# Patient Record
Sex: Female | Born: 2000
Health system: Southern US, Community
[De-identification: ages and names within clinical notes are randomized; demographics above are authoritative.]

## PROBLEM LIST (undated history)

## (undated) ENCOUNTER — Emergency Department (HOSPITAL_COMMUNITY): Admission: EM | Payer: Medicaid Other | Source: Home / Self Care

## (undated) ENCOUNTER — Emergency Department (HOSPITAL_BASED_OUTPATIENT_CLINIC_OR_DEPARTMENT_OTHER): Payer: No Typology Code available for payment source

## (undated) DIAGNOSIS — J45909 Unspecified asthma, uncomplicated: Secondary | ICD-10-CM

## (undated) HISTORY — PX: NO PAST SURGERIES: SHX2092

---
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
## 2021-01-01 ENCOUNTER — Telehealth (HOSPITAL_COMMUNITY): Payer: Self-pay | Admitting: Emergency Medicine

## 2021-01-01 LAB — CERVICOVAGINAL ANCILLARY ONLY
Bacterial Vaginitis (gardnerella): POSITIVE — AB
Chlamydia: NEGATIVE
Comment: NEGATIVE
Comment: NEGATIVE
Comment: NEGATIVE
Comment: NORMAL
Neisseria Gonorrhea: NEGATIVE
Trichomonas: NEGATIVE

## 2021-01-01 MED ORDER — METRONIDAZOLE 500 MG PO TABS: 500.0000 mg | ORAL_TABLET | Freq: Two times a day (BID) | ORAL | 0 refills | Status: DC

## 2021-01-18 ENCOUNTER — Telehealth: Payer: Self-pay | Admitting: Emergency Medicine

## 2021-01-18 MED ORDER — METRONIDAZOLE 500 MG PO TABS
500.0000 mg | ORAL_TABLET | Freq: Two times a day (BID) | ORAL | 0 refills | Status: DC
Start: 2021-01-18 — End: 2021-02-17

## 2021-02-17 ENCOUNTER — Other Ambulatory Visit: Payer: Self-pay

## 2021-02-17 ENCOUNTER — Ambulatory Visit
Admission: EM | Admit: 2021-02-17 | Discharge: 2021-02-17 | Disposition: A | Discharge status: Home / Self Care | Payer: Medicaid Other | Attending: Internal Medicine | Admitting: Internal Medicine

## 2021-02-17 DIAGNOSIS — R350 Frequency of micturition: Secondary | ICD-10-CM | POA: Insufficient documentation

## 2021-02-17 DIAGNOSIS — N898 Other specified noninflammatory disorders of vagina: Secondary | ICD-10-CM | POA: Diagnosis present

## 2021-02-17 DIAGNOSIS — Z113 Encounter for screening for infections with a predominantly sexual mode of transmission: Secondary | ICD-10-CM | POA: Insufficient documentation

## 2021-02-17 LAB — POCT URINALYSIS DIP (MANUAL ENTRY)
Bilirubin, UA: NEGATIVE
Blood, UA: NEGATIVE
Glucose, UA: NEGATIVE mg/dL
Ketones, POC UA: NEGATIVE mg/dL
Nitrite, UA: NEGATIVE
Protein Ur, POC: NEGATIVE mg/dL
Spec Grav, UA: 1.02 (ref 1.010–1.025)
Urobilinogen, UA: 0.2 E.U./dL
pH, UA: 7 (ref 5.0–8.0)

## 2021-02-17 MED ORDER — METRONIDAZOLE 500 MG PO TABS: 500.0000 mg | ORAL_TABLET | Freq: Two times a day (BID) | ORAL | 0 refills | Status: DC

## 2021-02-17 NOTE — Discharge Instructions (Addendum)
Your vaginal swab and urine culture are pending.  We will call if they are positive.  You have been prescribed metronidazole to take for possible bacterial vaginosis.  Please complete this prescription.  Refrain from sexual activity until test results and treatment are complete.

## 2021-02-17 NOTE — ED Triage Notes (Signed)
Pt states she was here about a month ago and dx with BV. States she interrupted her abx tx for "a night out drinking." States the symptoms never fully resolved though was starting to get better. States she is coming "off of my cycle" and notices a strong odor and would like to be tested and tx once more.   Associated discharge (moderate-significant, thick, white-greyish), frequency, urgency,

## 2021-02-17 NOTE — ED Provider Notes (Signed)
[name]    CSN: [id number] Arrival date & time: 02/17/21  1041      History   Chief Complaint Chief Complaint  Patient presents with   Vaginitis    HPI Heather Hopkins is a 20 y.o. female.   Patient presents with white-colored vaginal discharge and urinary frequency that has been present for approximately 1 month.  Patient was seen on 12/31/2020 and tested positive for bacterial vaginosis.  Was prescribed metronidazole but did not complete medication because she wanted to "go out drinking".  Symptoms never completely resolved and have persisted.  Patient denies irregular vaginal bleeding, pelvic pain, abdominal pain, back pain, fever, urinary burning.  Last menstrual cycle was a few days ago.  Denies any known exposure to STD.    Past Medical History:  Diagnosis Date   Asthma     There are no problems to display for this patient.   History reviewed. No pertinent surgical history.  OB History   No obstetric history on file.      Home Medications    Prior to Admission medications   Medication Sig Start Date End Date Taking? Authorizing Provider  acetaminophen (TYLENOL) 325 MG tablet Take 2 tablets (650 mg total) by mouth every 6 (six) hours as needed. 09/25/20   Linwood Dibbles, MD  metroNIDAZOLE (FLAGYL) 500 MG tablet Take 1 tablet (500 mg total) by mouth 2 (two) times daily. 02/17/21   Lance Muss, FNP  ondansetron (ZOFRAN-ODT) 8 MG disintegrating tablet Take 1 tablet (8 mg total) by mouth every 8 (eight) hours as needed for nausea or vomiting. 12/31/20   Wallis Bamberg, PA-C  promethazine (PHENERGAN) 12.5 MG tablet Take 12.5 mg by mouth daily. 04/25/19 09/25/19  [provider]    Family History Family History  Problem Relation Age of Onset   Lupus Mother    Hypertension Father     Social History Social History   Tobacco Use   Smoking status: Some Days    Types: Cigars   Smokeless tobacco: Never  Vaping Use   Vaping Use: Never used   Substance Use Topics   Alcohol use: Yes   Drug use: Yes    Types: Marijuana     Allergies   Patient has no known allergies.   Review of Systems Review of Systems Per HPI  Physical Exam Triage Vital Signs ED Triage Vitals [02/17/21 1119]  Enc Vitals Group     BP (!) 141/77     Pulse Rate 100     Resp 18     Temp 98.2 F (36.8 C)     Temp Source Oral     SpO2 96 %     Weight      Height      Head Circumference      Peak Flow      Pain Score 0     Pain Loc      Pain Edu?      Excl. in GC?    No data found.  Updated Vital Signs BP (!) 141/77 (BP Location: Left Arm)   Pulse 100   Temp 98.2 F (36.8 C) (Oral)   Resp 18   SpO2 96%   Visual Acuity Right Eye Distance:   Left Eye Distance:   Bilateral Distance:    Right Eye Near:   Left Eye Near:    Bilateral Near:     Physical Exam Constitutional:      General: She is not in acute distress.  Appearance: Normal appearance. She is not toxic-appearing or diaphoretic.  HENT:     Head: Normocephalic and atraumatic.  Eyes:     Extraocular Movements: Extraocular movements intact.     Conjunctiva/sclera: Conjunctivae normal.  Cardiovascular:     Rate and Rhythm: Normal rate and regular rhythm.     Pulses: Normal pulses.     Heart sounds: Normal heart sounds.  Pulmonary:     Effort: Pulmonary effort is normal. No respiratory distress.     Breath sounds: Normal breath sounds.  Genitourinary:    Comments: Deferred with shared decision making.  Self swab performed. Skin:    General: Skin is warm and dry.  Neurological:     General: No focal deficit present.     Mental Status: She is alert and oriented to person, place, and time. Mental status is at baseline.  Psychiatric:        Mood and Affect: Mood normal.        Behavior: Behavior normal.        Thought Content: Thought content normal.        Judgment: Judgment normal.     UC Treatments / Results  Labs (all labs ordered are listed, but only  abnormal results are displayed) Labs Reviewed  POCT URINALYSIS DIP (MANUAL ENTRY) - Abnormal; Notable for the following components:      Result Value   Leukocytes, UA Trace (*)    All other components within normal limits  URINE CULTURE  CERVICOVAGINAL ANCILLARY ONLY    EKG   Radiology No results found.  Procedures Procedures (including critical care time)  Medications Ordered in UC Medications - No data to display  Initial Impression / Assessment and Plan / UC Course  I have reviewed the triage vital signs and the nursing notes.  Pertinent labs & imaging results that were available during my care of the patient were reviewed by me and considered in my medical decision making (see chart for details).     Urinalysis was not definitive for urinary tract infection.  Cervicovaginal swab and urine culture are pending.  Will treat with metronidazole due to symptoms never completely resolving and previous positive results for BV.  Patient advised of the importance of completing medication.  Advised patient to refrain from sexual activity until test results are complete.  No red flags seen on exam.Discussed strict return precautions. Patient verbalized understanding and is agreeable with plan.  Final Clinical Impressions(s) / UC Diagnoses   Final diagnoses:  Vaginal discharge  Urinary frequency  Screening examination for venereal disease     Discharge Instructions      Your vaginal swab and urine culture are pending.  We will call if they are positive.  You have been prescribed metronidazole to take for possible bacterial vaginosis.  Please complete this prescription.  Refrain from sexual activity until test results and treatment are complete.     ED Prescriptions     Medication Sig Dispense Auth. Provider   metroNIDAZOLE (FLAGYL) 500 MG tablet Take 1 tablet (500 mg total) by mouth 2 (two) times daily. 14 tablet Lance Muss, FNP      PDMP not reviewed this  encounter.   Lance Muss, FNP 02/17/21 1218

## 2021-02-18 LAB — URINE CULTURE: Culture: NO GROWTH

## 2021-02-18 LAB — CERVICOVAGINAL ANCILLARY ONLY
Bacterial Vaginitis (gardnerella): POSITIVE — AB
Candida Glabrata: NEGATIVE
Candida Vaginitis: NEGATIVE
Chlamydia: NEGATIVE
Comment: NEGATIVE
Comment: NEGATIVE
Comment: NEGATIVE
Comment: NEGATIVE
Comment: NEGATIVE
Comment: NORMAL
Neisseria Gonorrhea: NEGATIVE
Trichomonas: NEGATIVE

## 2021-05-23 NOTE — L&D Delivery Note (Signed)
Patient was C/C/+2 and pushed for approximately 35 minutes with epidural.    NSVD  female infant, Apgars 9/9, weight pending.   The patient had bilateral anterior labial abrasions, not bleeding so no repair needed. Fundus was firm. EBL was expected amount. Placenta was delivered intact. Vagina was clear.  Delayed cord clamping done for 30-60 seconds while warming baby. Baby was vigorous and doing skin to skin with mother.  Heather Hopkins

## 2021-06-01 ENCOUNTER — Other Ambulatory Visit: Payer: Self-pay

## 2021-06-01 ENCOUNTER — Ambulatory Visit
Admission: EM | Admit: 2021-06-01 | Discharge: 2021-06-01 | Disposition: A | Discharge status: Home / Self Care | Payer: Medicaid Other | Attending: Internal Medicine | Admitting: Internal Medicine

## 2021-06-01 ENCOUNTER — Encounter: Payer: Self-pay | Admitting: Emergency Medicine

## 2021-06-01 DIAGNOSIS — N898 Other specified noninflammatory disorders of vagina: Secondary | ICD-10-CM | POA: Diagnosis not present

## 2021-06-01 DIAGNOSIS — Z3201 Encounter for pregnancy test, result positive: Secondary | ICD-10-CM

## 2021-06-01 LAB — POCT URINE PREGNANCY: Preg Test, Ur: POSITIVE — AB

## 2021-06-01 NOTE — ED Triage Notes (Signed)
Patient states that she's been having thicker than normal vaginal discharge.  No odor, some itching. White in color.  Patient did have a miscarriage on 03-25-2021 and requesting STI testing.

## 2021-06-01 NOTE — ED Provider Notes (Signed)
[name]    CSN: [id number] Arrival date & time: 06/01/21  1218      History   Chief Complaint Chief Complaint  Patient presents with   Vaginal Discharge    HPI Heather Hopkins is a 21 y.o. female.   Patient presents with vaginal discharge that has been present over the past few days.  She reports that it is white in color.  Denies urinary burning, urinary frequency, irregular vaginal bleeding, pelvic pain, back pain, fever, abdominal pain.  Denies any known exposure to STD but has had unprotected sexual intercourse recently.  Patient reports that her last menstrual cycle was 04/25/2021.  She had a miscarriage in November 2022.  She reports that she was followed by OB/GYN following this miscarriage and her hCG levels went from 300s to 80 to 0 by November 16.  She has not followed up with OB/GYN since this.  She does report that she has had some breast tenderness, abdominal cramping, nausea over the past few days as well.   Vaginal Discharge  Past Medical History:  Diagnosis Date   Asthma     There are no problems to display for this patient.   History reviewed. No pertinent surgical history.  OB History   No obstetric history on file.      Home Medications    Prior to Admission medications   Medication Sig Start Date End Date Taking? Authorizing Provider  Prenatal Vit-Fe Fumarate-FA (PRENATAL MULTIVITAMIN) TABS tablet Take 1 tablet by mouth daily at 12 noon.   Yes [provider]  acetaminophen (TYLENOL) 325 MG tablet Take 2 tablets (650 mg total) by mouth every 6 (six) hours as needed. 09/25/20   Linwood DibblesKnapp, Jon, MD  metroNIDAZOLE (FLAGYL) 500 MG tablet Take 1 tablet (500 mg total) by mouth 2 (two) times daily. 02/17/21   Gustavus BryantMound, Rossana Molchan E, FNP  ondansetron (ZOFRAN-ODT) 8 MG disintegrating tablet Take 1 tablet (8 mg total) by mouth every 8 (eight) hours as needed for nausea or vomiting. 12/31/20   Wallis BambergMani, Mario, PA-C  promethazine (PHENERGAN) 12.5 MG tablet  Take 12.5 mg by mouth daily. 04/25/19 09/25/19  [provider]    Family History Family History  Problem Relation Age of Onset   Lupus Mother    Hypertension Father     Social History Social History   Tobacco Use   Smoking status: Some Days    Types: Cigars   Smokeless tobacco: Never  Vaping Use   Vaping Use: Never used  Substance Use Topics   Alcohol use: Yes   Drug use: Yes    Types: Marijuana     Allergies   Patient has no known allergies.   Review of Systems Review of Systems Per HPI  Physical Exam Triage Vital Signs ED Triage Vitals  Enc Vitals Group     BP 06/01/21 1301 102/65     Pulse Rate 06/01/21 1301 91     Resp 06/01/21 1301 18     Temp 06/01/21 1301 98.6 F (37 C)     Temp Source 06/01/21 1301 Oral     SpO2 06/01/21 1301 98 %     Weight 06/01/21 1302 96 lb (43.5 kg)     Height 06/01/21 1302 5' (1.524 m)     Head Circumference --      Peak Flow --      Pain Score 06/01/21 1302 0     Pain Loc --      Pain Edu? --  Excl. in GC? --    No data found.  Updated Vital Signs BP 102/65 (BP Location: Left Arm)    Pulse 91    Temp 98.6 F (37 C) (Oral)    Resp 18    Ht 5' (1.524 m)    Wt 96 lb (43.5 kg)    LMP 04/25/2021 (Exact Date)    SpO2 98%    BMI 18.75 kg/m   Visual Acuity Right Eye Distance:   Left Eye Distance:   Bilateral Distance:    Right Eye Near:   Left Eye Near:    Bilateral Near:     Physical Exam Constitutional:      General: She is not in acute distress.    Appearance: Normal appearance. She is not toxic-appearing or diaphoretic.  HENT:     Head: Normocephalic and atraumatic.  Eyes:     Extraocular Movements: Extraocular movements intact.     Conjunctiva/sclera: Conjunctivae normal.  Cardiovascular:     Rate and Rhythm: Normal rate and regular rhythm.     Pulses: Normal pulses.     Heart sounds: Normal heart sounds.  Pulmonary:     Effort: Pulmonary effort is normal. No respiratory distress.     Breath  sounds: Normal breath sounds.  Genitourinary:    Comments: Deferred with shared decision making.  Self swab performed. Neurological:     General: No focal deficit present.     Mental Status: She is alert and oriented to person, place, and time. Mental status is at baseline.  Psychiatric:        Mood and Affect: Mood normal.        Behavior: Behavior normal.        Thought Content: Thought content normal.        Judgment: Judgment normal.     UC Treatments / Results  Labs (all labs ordered are listed, but only abnormal results are displayed) Labs Reviewed  POCT URINE PREGNANCY - Abnormal; Notable for the following components:      Result Value   Preg Test, Ur Positive (*)    All other components within normal limits  BETA HCG QUANT (REF LAB)  CERVICOVAGINAL ANCILLARY ONLY    EKG   Radiology No results found.  Procedures Procedures (including critical care time)  Medications Ordered in UC Medications - No data to display  Initial Impression / Assessment and Plan / UC Course  I have reviewed the triage vital signs and the nursing notes.  Pertinent labs & imaging results that were available during my care of the patient were reviewed by me and considered in my medical decision making (see chart for details).     Cervicovaginal swab pending.  Will await results before treatment.  Patient had positive urine pregnancy test.  Highly suspicious that this could be legitimate result as opposed to persistent positive urine pregnancy following miscarriage given the patient has missed a menstrual cycle, hCG was 0 in November, and she is having early pregnancy symptoms. Although, I can not see hCG results, so this is per patient.  Patient was advised to follow-up with OB/GYN as soon as possible for further evaluation of this.  Will complete beta-hCG today to confirm.  Patient verbalized understanding and was agreeable with plan. Final Clinical Impressions(s) / UC Diagnoses   Final  diagnoses:  Vaginal discharge  Pregnancy test positive     Discharge Instructions      Your pregnancy test was positive.  Please follow-up with OB/GYN for further  evaluation and management.  Your blood work is pending.  Vaginal swab is pending.  We will call if it is positive.    ED Prescriptions   None    PDMP not reviewed this encounter.   Teodora Medici, [place] 06/01/21 1359

## 2021-06-01 NOTE — Discharge Instructions (Addendum)
Your pregnancy test was positive.  Please follow-up with OB/GYN for further evaluation and management.  Your blood work is pending.  Vaginal swab is pending.  We will call if it is positive.

## 2021-06-02 ENCOUNTER — Telehealth (HOSPITAL_COMMUNITY): Payer: Self-pay | Admitting: Emergency Medicine

## 2021-06-02 LAB — CERVICOVAGINAL ANCILLARY ONLY
Bacterial Vaginitis (gardnerella): NEGATIVE
Candida Glabrata: NEGATIVE
Candida Vaginitis: POSITIVE — AB
Chlamydia: NEGATIVE
Comment: NEGATIVE
Comment: NEGATIVE
Comment: NEGATIVE
Comment: NEGATIVE
Comment: NEGATIVE
Comment: NORMAL
Neisseria Gonorrhea: NEGATIVE
Trichomonas: NEGATIVE

## 2021-06-02 LAB — BETA HCG QUANT (REF LAB): hCG Quant: 153 m[IU]/mL

## 2021-06-02 MED ORDER — CLOTRIMAZOLE 1 % VA CREA: 1.0000 | TOPICAL_CREAM | Freq: Every day | VAGINAL | 0 refills | Status: DC

## 2021-06-30 ENCOUNTER — Other Ambulatory Visit: Payer: Self-pay

## 2021-06-30 ENCOUNTER — Inpatient Hospital Stay (HOSPITAL_COMMUNITY): Payer: Medicaid Other

## 2021-06-30 ENCOUNTER — Inpatient Hospital Stay (HOSPITAL_COMMUNITY)
Admission: AD | Admit: 2021-06-30 | Discharge: 2021-06-30 | Disposition: A | Discharge status: Home / Self Care | Payer: Medicaid Other | Attending: Family Medicine | Admitting: Family Medicine

## 2021-06-30 ENCOUNTER — Encounter (HOSPITAL_COMMUNITY): Payer: Self-pay | Admitting: *Deleted

## 2021-06-30 DIAGNOSIS — Z3491 Encounter for supervision of normal pregnancy, unspecified, first trimester: Secondary | ICD-10-CM | POA: Diagnosis not present

## 2021-06-30 DIAGNOSIS — O26891 Other specified pregnancy related conditions, first trimester: Secondary | ICD-10-CM | POA: Diagnosis not present

## 2021-06-30 DIAGNOSIS — O99322 Drug use complicating pregnancy, second trimester: Secondary | ICD-10-CM | POA: Diagnosis not present

## 2021-06-30 DIAGNOSIS — Z3A01 Less than 8 weeks gestation of pregnancy: Secondary | ICD-10-CM | POA: Diagnosis not present

## 2021-06-30 DIAGNOSIS — O99331 Smoking (tobacco) complicating pregnancy, first trimester: Secondary | ICD-10-CM | POA: Insufficient documentation

## 2021-06-30 DIAGNOSIS — F1290 Cannabis use, unspecified, uncomplicated: Secondary | ICD-10-CM | POA: Insufficient documentation

## 2021-06-30 DIAGNOSIS — F17290 Nicotine dependence, other tobacco product, uncomplicated: Secondary | ICD-10-CM | POA: Insufficient documentation

## 2021-06-30 DIAGNOSIS — O219 Vomiting of pregnancy, unspecified: Secondary | ICD-10-CM | POA: Diagnosis not present

## 2021-06-30 DIAGNOSIS — O210 Mild hyperemesis gravidarum: Secondary | ICD-10-CM | POA: Insufficient documentation

## 2021-06-30 DIAGNOSIS — R109 Unspecified abdominal pain: Secondary | ICD-10-CM | POA: Diagnosis not present

## 2021-06-30 DIAGNOSIS — R42 Dizziness and giddiness: Secondary | ICD-10-CM | POA: Diagnosis present

## 2021-06-30 LAB — URINALYSIS, ROUTINE W REFLEX MICROSCOPIC
Bilirubin Urine: NEGATIVE
Glucose, UA: NEGATIVE mg/dL
Hgb urine dipstick: NEGATIVE
Ketones, ur: NEGATIVE mg/dL
Leukocytes,Ua: NEGATIVE
Nitrite: NEGATIVE
Protein, ur: NEGATIVE mg/dL
Specific Gravity, Urine: 1.021 (ref 1.005–1.030)
pH: 8 (ref 5.0–8.0)

## 2021-06-30 LAB — COMPREHENSIVE METABOLIC PANEL
ALT: 15 U/L (ref 0–44)
AST: 15 U/L (ref 15–41)
Albumin: 3.8 g/dL (ref 3.5–5.0)
Alkaline Phosphatase: 34 U/L — ABNORMAL LOW (ref 38–126)
Anion gap: 10 (ref 5–15)
BUN: 9 mg/dL (ref 6–20)
CO2: 20 mmol/L — ABNORMAL LOW (ref 22–32)
Calcium: 9.1 mg/dL (ref 8.9–10.3)
Chloride: 105 mmol/L (ref 98–111)
Creatinine, Ser: 0.59 mg/dL (ref 0.44–1.00)
GFR, Estimated: >60 mL/min (ref >60)
Glucose, Bld: 76 mg/dL (ref 70–99)
Potassium: 3.8 mmol/L (ref 3.5–5.1)
Sodium: 135 mmol/L (ref 135–145)
Total Bilirubin: 0.6 mg/dL (ref 0.3–1.2)
Total Protein: 6.8 g/dL (ref 6.5–8.1)

## 2021-06-30 LAB — WET PREP, GENITAL
Clue Cells Wet Prep HPF POC: NONE SEEN
Sperm: NONE SEEN
Trich, Wet Prep: NONE SEEN
WBC, Wet Prep HPF POC: >=10 — AB (ref <10)
Yeast Wet Prep HPF POC: NONE SEEN

## 2021-06-30 LAB — CBC
HCT: 34.8 % — ABNORMAL LOW (ref 36.0–46.0)
Hemoglobin: 11.6 g/dL — ABNORMAL LOW (ref 12.0–15.0)
MCH: 30 pg (ref 26.0–34.0)
MCHC: 33.3 g/dL (ref 30.0–36.0)
MCV: 89.9 fL (ref 80.0–100.0)
Platelets: 276 10*3/uL (ref 150–400)
RBC: 3.87 MIL/uL (ref 3.87–5.11)
RDW: 12.5 % (ref 11.5–15.5)
WBC: 9.9 10*3/uL (ref 4.0–10.5)
nRBC: 0 % (ref 0.0–0.2)

## 2021-06-30 LAB — ABO/RH: ABO/RH(D): O POS

## 2021-06-30 LAB — HIV ANTIBODY (ROUTINE TESTING W REFLEX): HIV Screen 4th Generation wRfx: NONREACTIVE

## 2021-06-30 LAB — HCG, QUANTITATIVE, PREGNANCY: hCG, Beta Chain, Quant, S: 225154 m[IU]/mL — ABNORMAL HIGH (ref <5)

## 2021-06-30 MED ORDER — PROMETHAZINE HCL 12.5 MG PO TABS: 12.5000 mg | ORAL_TABLET | Freq: Four times a day (QID) | ORAL | 2 refills | Status: DC | PRN

## 2021-06-30 MED ORDER — METOCLOPRAMIDE HCL 10 MG PO TABS: 10.0000 mg | ORAL_TABLET | Freq: Three times a day (TID) | ORAL | 2 refills | Status: DC | PRN

## 2021-06-30 MED ORDER — METOCLOPRAMIDE HCL 10 MG PO TABS: 10.0000 mg | ORAL_TABLET | Freq: Once | ORAL | Status: DC

## 2021-06-30 NOTE — MAU Note (Signed)
Presents stating she doesn't have any appetite, reports she has occasional N/V but food isn't appealing.  Reports has lost 4lbs since 06/15/21.  Also states every morning upon waking she's dizzy & dizziness is worsening. Denies VB.  LMP 04/25/2021.

## 2021-06-30 NOTE — MAU Provider Note (Signed)
Chief Complaint: Dizziness and Lack of Appetite   Event Date/Time   First Provider Initiated Contact with Patient 06/30/21 1329      SUBJECTIVE HPI: Heather Hopkins is a 21 y.o. G2P0010 at 345w3d by LMP who presents to maternity admissions reporting mild intermittent abdominal pain, lack of appetite, and dizziness when standing.  Symptoms started 1-2 weeks ago and are worsening.      HPI  Past Medical History:  Diagnosis Date   Asthma    History reviewed. No pertinent surgical history. Social History   Socioeconomic History   Marital status: Single    Spouse name: Not on file   Number of children: Not on file   Years of education: Not on file   Highest education level: Not on file  Occupational History   Not on file  Tobacco Use   Smoking status: Some Days    Types: Cigars   Smokeless tobacco: Never  Vaping Use   Vaping Use: Never used  Substance and Sexual Activity   Alcohol use: Yes   Drug use: Yes    Types: Marijuana   Sexual activity: Yes    Birth control/protection: None  Other Topics Concern   Not on file  Social History Narrative   Not on file   Social Determinants of Health   Financial Resource Strain: Not on file  Food Insecurity: Not on file  Transportation Needs: Not on file  Physical Activity: Not on file  Stress: Not on file  Social Connections: Not on file  Intimate Partner Violence: Not on file   No current facility-administered medications on file prior to encounter.   Current Outpatient Medications on File Prior to Encounter  Medication Sig Dispense Refill   acetaminophen (TYLENOL) 325 MG tablet Take 2 tablets (650 mg total) by mouth every 6 (six) hours as needed. 30 tablet 0   clotrimazole (GYNE-LOTRIMIN) 1 % vaginal cream Place 1 Applicatorful vaginally at bedtime. 45 g 0   metroNIDAZOLE (FLAGYL) 500 MG tablet Take 1 tablet (500 mg total) by mouth 2 (two) times daily. 14 tablet 0   ondansetron (ZOFRAN-ODT) 8 MG disintegrating tablet Take 1  tablet (8 mg total) by mouth every 8 (eight) hours as needed for nausea or vomiting. 20 tablet 0   Prenatal Vit-Fe Fumarate-FA (PRENATAL MULTIVITAMIN) TABS tablet Take 1 tablet by mouth daily at 12 noon.     No Known Allergies  ROS:  Review of Systems  Constitutional:  Positive for appetite change and unexpected weight change. Negative for chills, fatigue and fever.  Respiratory:  Negative for shortness of breath.   Cardiovascular:  Negative for chest pain.  Gastrointestinal:  Positive for abdominal pain and nausea.  Genitourinary:  Positive for pelvic pain. Negative for difficulty urinating, dysuria, flank pain, vaginal bleeding, vaginal discharge and vaginal pain.  Neurological:  Negative for dizziness and headaches.  Psychiatric/Behavioral: Negative.      I have reviewed patient's Past Medical Hx, Surgical Hx, Family Hx, Social Hx, medications and allergies.   Physical Exam  Patient Vitals for the past 24 hrs:  BP Temp Temp src Pulse Resp SpO2 Weight  06/30/21 1551 119/67 -- -- 89 -- -- --  06/30/21 1257 113/63 -- -- 63 -- -- --  06/30/21 1249 -- -- -- -- -- -- 41.4 kg  06/30/21 1244 101/60 98 F (36.7 C) Oral 73 20 100 % --   Constitutional: Well-developed, well-nourished female in no acute distress.  Cardiovascular: normal rate Respiratory: normal effort GI: Abd soft, non-tender.  Pos BS x 4 MS: Extremities nontender, no edema, normal ROM Neurologic: Alert and oriented x 4.  GU: Neg CVAT.  PELVIC EXAM: vaginal cultures by self swab   LAB RESULTS Results for orders placed or performed during the hospital encounter of 06/30/21 (from the past 24 hour(s))  Urinalysis, Routine w reflex microscopic Urine, Clean Catch     Status: Abnormal   Collection Time: 06/30/21 12:46 PM  Result Value Ref Range   Color, Urine YELLOW YELLOW   APPearance CLOUDY (A) CLEAR   Specific Gravity, Urine 1.021 1.005 - 1.030   pH 8.0 5.0 - 8.0   Glucose, UA NEGATIVE NEGATIVE mg/dL   Hgb urine  dipstick NEGATIVE NEGATIVE   Bilirubin Urine NEGATIVE NEGATIVE   Ketones, ur NEGATIVE NEGATIVE mg/dL   Protein, ur NEGATIVE NEGATIVE mg/dL   Nitrite NEGATIVE NEGATIVE   Leukocytes,Ua NEGATIVE NEGATIVE  Wet prep, genital     Status: Abnormal   Collection Time: 06/30/21  2:13 PM   Specimen: PATH Cytology Cervicovaginal Ancillary Only  Result Value Ref Range   Yeast Wet Prep HPF POC NONE SEEN NONE SEEN   Trich, Wet Prep NONE SEEN NONE SEEN   Clue Cells Wet Prep HPF POC NONE SEEN NONE SEEN   WBC, Wet Prep HPF POC >=10 (A) <10   Sperm NONE SEEN   Comprehensive metabolic panel     Status: Abnormal   Collection Time: 06/30/21  2:20 PM  Result Value Ref Range   Sodium 135 135 - 145 mmol/L   Potassium 3.8 3.5 - 5.1 mmol/L   Chloride 105 98 - 111 mmol/L   CO2 20 (L) 22 - 32 mmol/L   Glucose, Bld 76 70 - 99 mg/dL   BUN 9 6 - 20 mg/dL   Creatinine, Ser 4.16 0.44 - 1.00 mg/dL   Calcium 9.1 8.9 - 60.6 mg/dL   Total Protein 6.8 6.5 - 8.1 g/dL   Albumin 3.8 3.5 - 5.0 g/dL   AST 15 15 - 41 U/L   ALT 15 0 - 44 U/L   Alkaline Phosphatase 34 (L) 38 - 126 U/L   Total Bilirubin 0.6 0.3 - 1.2 mg/dL   GFR, Estimated >30 >16 mL/min   Anion gap 10 5 - 15  CBC     Status: Abnormal   Collection Time: 06/30/21  2:20 PM  Result Value Ref Range   WBC 9.9 4.0 - 10.5 K/uL   RBC 3.87 3.87 - 5.11 MIL/uL   Hemoglobin 11.6 (L) 12.0 - 15.0 g/dL   HCT 01.0 (L) 93.2 - 35.5 %   MCV 89.9 80.0 - 100.0 fL   MCH 30.0 26.0 - 34.0 pg   MCHC 33.3 30.0 - 36.0 g/dL   RDW 73.2 20.2 - 54.2 %   Platelets 276 150 - 400 K/uL   nRBC 0.0 0.0 - 0.2 %  ABO/Rh     Status: None   Collection Time: 06/30/21  2:20 PM  Result Value Ref Range   ABO/RH(D) O POS    No rh immune globuloin      NOT A RH IMMUNE GLOBULIN CANDIDATE, PT RH POSITIVE Performed at [facility], [address] [place], [place] [address]     --/--/O POS (02/08 1420)  IMAGING US OB Comp Less 14 Wks  Result Date: 06/30/2021 CLINICAL DATA:   Abdominal pain, nausea and vomiting. Nine weeks and 3 days pregnant by last menstrual period. EXAM: OBSTETRIC <14 WK ULTRASOUND TECHNIQUE: Transabdominal ultrasound was performed for evaluation  of the gestation as well as the maternal uterus and adnexal regions. COMPARISON:  03/23/2021. FINDINGS: Intrauterine gestational sac: Visualized Yolk sac:  Visualized Embryo:  Visualized Cardiac Activity: Visualized Heart Rate: 157 bpm CRL:   16.0 mm   7 w 6 d                  [place] EDC: 02/10/2022 Subchorionic hemorrhage:  None visualized. Maternal uterus/adnexae: Poorly visualized right ovary, grossly normal. The left ovary was not identified. No free peritoneal fluid. IMPRESSION: Single live intrauterine gestation with an estimated gestational age of [redacted] weeks and 6 days. No complicating features. Electronically Signed   By: Beckie Salts M.D.   On: 06/30/2021 14:50    MAU Management/MDM: Orders Placed This Encounter  Procedures   Wet prep, genital   US OB Comp Less 14 Wks   Urinalysis, Routine w reflex microscopic Urine, Clean Catch   Comprehensive metabolic panel   CBC   hCG, quantitative, pregnancy   HIV Antibody (routine testing w rflx)   ABO/Rh   Discharge patient    Meds ordered this encounter  Medications   DISCONTD: metoCLOPramide (REGLAN) tablet 10 mg   metoCLOPramide (REGLAN) 10 MG tablet    Sig: Take 1 tablet (10 mg total) by mouth 3 (three) times daily with meals as needed for nausea.    Dispense:  60 tablet    Refill:  2    Order Specific Question:   Supervising Provider    Answer:   Reva Bores [[id number]]   promethazine (PHENERGAN) 12.5 MG tablet    Sig: Take 1-2 tablets (12.5-25 mg total) by mouth every 6 (six) hours as needed for nausea or vomiting.    Dispense:  30 tablet    Refill:  2    Order Specific Question:   Supervising Provider    Answer:   Reva Bores [[id number]]    IUP noted on today's [place].  CBC, CMP wnl.  Dizziness is likely dehydration/lack of food.  Rx for  Reglan/Phenergan. Pt to work on eating/drinking more.  Discussed diet options to try.  Pt had intake visit at [facility] but is interested in transferring to another practice. List of providers given.  Precautions/reasons to return to MAU reviewed.   ASSESSMENT 1. Normal IUP (intrauterine pregnancy) on prenatal ultrasound, first trimester   2. Abdominal pain during pregnancy in first trimester   3. Nausea and vomiting during pregnancy prior to [redacted] weeks gestation   4. [redacted] weeks gestation of pregnancy     PLAN Discharge home Allergies as of 06/30/2021   No Known Allergies      Medication List     TAKE these medications    acetaminophen 325 MG tablet Commonly known as: Tylenol Take 2 tablets (650 mg total) by mouth every 6 (six) hours as needed.   clotrimazole 1 % vaginal cream Commonly known as: GYNE-LOTRIMIN Place 1 Applicatorful vaginally at bedtime.   metoCLOPramide 10 MG tablet Commonly known as: Reglan Take 1 tablet (10 mg total) by mouth 3 (three) times daily with meals as needed for nausea.   metroNIDAZOLE 500 MG tablet Commonly known as: FLAGYL Take 1 tablet (500 mg total) by mouth 2 (two) times daily.   ondansetron 8 MG disintegrating tablet Commonly known as: ZOFRAN-ODT Take 1 tablet (8 mg total) by mouth every 8 (eight) hours as needed for nausea or vomiting.   prenatal multivitamin Tabs tablet Take 1 tablet by mouth daily at 12 noon.   promethazine  12.5 MG tablet Commonly known as: PHENERGAN Take 1-2 tablets (12.5-25 mg total) by mouth every 6 (six) hours as needed for nausea or vomiting.        Follow-up Information     prenatal provider of your choice Follow up.   Why: See list provided        [facility] Follow up.   Specialty: Obstetrics and Gynecology Why: As needed for emergencies Contact information: [address] [id number] [place] [address] [phone]                Sharen Counter Certified Nurse-Midwife 06/30/2021  3:53 PM

## 2021-06-30 NOTE — Discharge Instructions (Signed)
Prenatal Care Providers           [facility]  [address] [phone]  [facility]   [address]  [phone]  [facility] [place]       [address] [phone]            [facility] [place]     [address] [phone]          [facility] [place]   [address] [phone]  [facility]  [address] [phone]     [facility]  [address]   [phone]     [facility]  Phone: [phone]  [facility]  Phone: [phone]  [facility] Phone: [phone]  [facility] Phone: [phone]  [facility] Phone: [phone]  [facility] Phone: [phone]  [facility]  Phone: [phone]  

## 2021-07-01 LAB — GC/CHLAMYDIA PROBE AMP (~~LOC~~) NOT AT ARMC
Chlamydia: NEGATIVE
Comment: NEGATIVE
Comment: NORMAL
Neisseria Gonorrhea: NEGATIVE

## 2021-07-03 ENCOUNTER — Inpatient Hospital Stay (HOSPITAL_COMMUNITY)
Admission: AD | Admit: 2021-07-03 | Discharge: 2021-07-03 | Disposition: A | Discharge status: Home / Self Care | Payer: Medicaid Other | Attending: Obstetrics | Admitting: Obstetrics

## 2021-07-03 ENCOUNTER — Encounter (HOSPITAL_COMMUNITY): Payer: Self-pay | Admitting: Obstetrics

## 2021-07-03 DIAGNOSIS — O99321 Drug use complicating pregnancy, first trimester: Secondary | ICD-10-CM | POA: Diagnosis not present

## 2021-07-03 DIAGNOSIS — O210 Mild hyperemesis gravidarum: Secondary | ICD-10-CM | POA: Insufficient documentation

## 2021-07-03 DIAGNOSIS — R112 Nausea with vomiting, unspecified: Secondary | ICD-10-CM

## 2021-07-03 DIAGNOSIS — O218 Other vomiting complicating pregnancy: Secondary | ICD-10-CM | POA: Insufficient documentation

## 2021-07-03 DIAGNOSIS — Z3A08 8 weeks gestation of pregnancy: Secondary | ICD-10-CM | POA: Insufficient documentation

## 2021-07-03 DIAGNOSIS — F1290 Cannabis use, unspecified, uncomplicated: Secondary | ICD-10-CM | POA: Diagnosis not present

## 2021-07-03 LAB — URINALYSIS, ROUTINE W REFLEX MICROSCOPIC
Bilirubin Urine: NEGATIVE
Glucose, UA: NEGATIVE mg/dL
Hgb urine dipstick: NEGATIVE
Ketones, ur: 80 mg/dL — AB
Nitrite: NEGATIVE
Protein, ur: 30 mg/dL — AB
Specific Gravity, Urine: 1.034 — ABNORMAL HIGH (ref 1.005–1.030)
pH: 5 (ref 5.0–8.0)

## 2021-07-03 MED ORDER — ONDANSETRON HCL 4 MG/2ML IJ SOLN
4.0000 mg | Freq: Once | INTRAMUSCULAR | Status: AC
  Administered 2021-07-03: 4 mg via INTRAVENOUS
  Filled 2021-07-03: qty 2

## 2021-07-03 MED ORDER — M.V.I. ADULT IV INJ
Freq: Once | INTRAVENOUS | Status: AC
  Filled 2021-07-03: qty 1000

## 2021-07-03 MED ORDER — PROMETHAZINE HCL 25 MG/ML IJ SOLN
25.0000 mg | Freq: Once | INTRAVENOUS | Status: AC
  Administered 2021-07-03: 25 mg via INTRAVENOUS
  Filled 2021-07-03: qty 1

## 2021-07-03 MED ORDER — PANTOPRAZOLE SODIUM 20 MG PO TBEC
20.0000 mg | DELAYED_RELEASE_TABLET | Freq: Every day | ORAL | 0 refills | Status: DC
Start: 2021-07-03 — End: 2022-01-22

## 2021-07-03 MED ORDER — FAMOTIDINE IN NACL 20-0.9 MG/50ML-% IV SOLN
20.0000 mg | Freq: Once | INTRAVENOUS | Status: AC
  Administered 2021-07-03: 20 mg via INTRAVENOUS
  Filled 2021-07-03: qty 50

## 2021-07-03 NOTE — MAU Provider Note (Signed)
History     CSN: [id number]  Arrival date and time: 07/03/21 0028   Event Date/Time   First Provider Initiated Contact with Patient 07/03/21 0115      Chief Complaint  Patient presents with   Emesis   Ms. Heather Hopkins is a 21 y.o. year old G10P0010 female at [redacted]w[redacted]d weeks gestation who presents to MAU reporting vomiting all day. She was in MAU on Wednesday for the same complaints. She was Rx'd Phenergan and Reglan. She took Reglan at 1400 and Phenergan at 2100, but vomited them back up. She feels weak. She has not established [facility] yet.   OB History     Gravida  2   Para      Term      Preterm      AB  1   Living         SAB  1   IAB      Ectopic      Multiple      Live Births              Past Medical History:  Diagnosis Date   Asthma     Past Surgical History:  Procedure Laterality Date   NO PAST SURGERIES      Family History  Problem Relation Age of Onset   Lupus Mother    Hypertension Father     Social History   Tobacco Use   Smoking status: Some Days    Types: Cigars   Smokeless tobacco: Never  Vaping Use   Vaping Use: Never used  Substance Use Topics   Alcohol use: Yes   Drug use: Yes    Types: Marijuana    Comment: LAST SMOKED - THIS AM    Allergies: No Known Allergies  Medications Prior to Admission  Medication Sig Dispense Refill Last Dose   metoCLOPramide (REGLAN) 10 MG tablet Take 1 tablet (10 mg total) by mouth 3 (three) times daily with meals as needed for nausea. 60 tablet 2 07/02/2021 at 1415   promethazine (PHENERGAN) 12.5 MG tablet Take 1-2 tablets (12.5-25 mg total) by mouth every 6 (six) hours as needed for nausea or vomiting. 30 tablet 2 07/02/2021 at 2100   acetaminophen (TYLENOL) 325 MG tablet Take 2 tablets (650 mg total) by mouth every 6 (six) hours as needed. 30 tablet 0    clotrimazole (GYNE-LOTRIMIN) 1 % vaginal cream Place 1 Applicatorful vaginally at bedtime. 45 g 0    metroNIDAZOLE (FLAGYL)  500 MG tablet Take 1 tablet (500 mg total) by mouth 2 (two) times daily. 14 tablet 0 More than a month   ondansetron (ZOFRAN-ODT) 8 MG disintegrating tablet Take 1 tablet (8 mg total) by mouth every 8 (eight) hours as needed for nausea or vomiting. 20 tablet 0 More than a month   Prenatal Vit-Fe Fumarate-FA (PRENATAL MULTIVITAMIN) TABS tablet Take 1 tablet by mouth daily at 12 noon.       Review of Systems  Constitutional:  Positive for appetite change and fatigue.  HENT: Negative.    Eyes: Negative.   Respiratory: Negative.    Cardiovascular: Negative.   Gastrointestinal:  Positive for nausea and vomiting (all day).  Endocrine: Negative.   Genitourinary: Negative.   Musculoskeletal: Negative.   Skin: Negative.   Allergic/Immunologic: Negative.   Neurological:  Positive for weakness.  Hematological: Negative.   Psychiatric/Behavioral: Negative.    Physical Exam   Blood pressure 108/65, pulse 98, temperature 98 F (36.7 C), temperature source Oral,  resp. rate 20, height 5' (1.524 m), weight 39.3 kg, last menstrual period 04/25/2021.  Physical Exam Vitals and nursing note reviewed.  Constitutional:      Appearance: Normal appearance. She is normal weight.  Genitourinary:    Comments: Not indicated Skin:    General: Skin is warm and dry.  Neurological:     Mental Status: She is alert and oriented to person, place, and time.  Psychiatric:        Mood and Affect: Mood normal.        Behavior: Behavior normal.        Thought Content: Thought content normal.        Judgment: Judgment normal.    MAU Course  Procedures  MDM CCUA IVFs: Phenergan 25 mg in LR 1000 ml @ 999 ml/hr; followed by MVI in LR 1000 ml @ 999 ml/hr -- relieved nausea/vomiting Pepcid 20 mg IVPB Zofran 4 mg IVPB PO Challenge -- patient tolerated well  Results for orders placed or performed during the hospital encounter of 07/03/21 (from the past 24 hour(s))  Urinalysis, Routine w reflex microscopic  Urine, Clean Catch     Status: Abnormal   Collection Time: 07/03/21 12:59 AM  Result Value Ref Range   Color, Urine YELLOW YELLOW   APPearance HAZY (A) CLEAR   Specific Gravity, Urine 1.034 (H) 1.005 - 1.030   pH 5.0 5.0 - 8.0   Glucose, UA NEGATIVE NEGATIVE mg/dL   Hgb urine dipstick NEGATIVE NEGATIVE   Bilirubin Urine NEGATIVE NEGATIVE   Ketones, ur 80 (A) NEGATIVE mg/dL   Protein, ur 30 (A) NEGATIVE mg/dL   Nitrite NEGATIVE NEGATIVE   Leukocytes,Ua SMALL (A) NEGATIVE   RBC / HPF 0-5 0 - 5 RBC/hpf   WBC, UA 21-50 0 - 5 WBC/hpf   Bacteria, UA RARE (A) NONE SEEN   Squamous Epithelial / LPF 0-5 0 - 5   Mucus PRESENT     Assessment and Plan  Cannabinoid hyperemesis syndrome  - Advised to stop smoking Marijuana in order to feel better - Advised to use Phenergan vaginally instead of orally for better absorption and staying in system - Information provided on cannabinoid hyperemesis syndrome   [redacted] weeks gestation of pregnancy  - Information provided on safe meds in preg and [place] OB providers - Advised to establish [facility]  - Discharge patient - Patient verbalized an understanding of the plan of care and agrees.    Raelyn Mora, CNM 07/03/2021, 1:15 AM

## 2021-07-03 NOTE — Progress Notes (Signed)
Discharge instructions reviewed with pt and significant other; pt informed on new prescription medication and cannabinoid hyperemesis. Instructed on when to return and follow up care. Both pt and significant other verbalized understanding.

## 2021-07-03 NOTE — Discharge Instructions (Addendum)
YOU MUST STOP SMOKING MARIJUANA IN ORDER FOR YOU TO START FEELING BETTER Insert the Promethazine in your vagina instead of by mouth in order to treat nausea and vomiting.  Safe Medications in Pregnancy   Acne: Benzoyl Peroxide Salicylic Acid  Backache/Headache: Tylenol: 2 regular strength every 4 hours OR              2 Extra strength every 6 hours  Colds/Coughs/Allergies: Benadryl (alcohol free) 25 mg every 6 hours as needed Breath right strips Claritin Cepacol throat lozenges Chloraseptic throat spray Cold-Eeze- up to three times per day Cough drops, alcohol free Flonase (by prescription only) Guaifenesin Mucinex Robitussin DM (plain only, alcohol free) Saline nasal spray/drops Sudafed (pseudoephedrine) & Actifed ** use only after [redacted] weeks gestation and if you do not have high blood pressure Tylenol Vicks Vaporub Zinc lozenges Zyrtec   Constipation: Colace Ducolax suppositories Fleet enema Glycerin suppositories Metamucil Milk of magnesia Miralax Senokot Smooth move tea  Diarrhea: Kaopectate Imodium A-D  *NO pepto Bismol  Hemorrhoids: Anusol Anusol HC Preparation H Tucks  Indigestion: Tums Maalox Mylanta Zantac  Pepcid  Insomnia: Benadryl (alcohol free) 25mg  every 6 hours as needed Tylenol PM Unisom, no Gelcaps  Leg Cramps: Tums MagGel  Nausea/Vomiting:  Bonine Dramamine Emetrol Ginger extract Sea bands Meclizine  Nausea medication to take during pregnancy:  Unisom (doxylamine succinate 25 mg tablets) Take one tablet daily at bedtime. If symptoms are not adequately controlled, the dose can be increased to a maximum recommended dose of two tablets daily (1/2 tablet in the morning, 1/2 tablet mid-afternoon and one at bedtime). Vitamin B6 100mg  tablets. Take one tablet twice a day (up to 200 mg per day).  Skin Rashes: Aveeno products Benadryl cream or 25mg  every 6 hours as needed Calamine Lotion 1% cortisone cream  Yeast  infection: Gyne-lotrimin 7 Monistat 7   **If taking multiple medications, please check labels to avoid duplicating the same active ingredients **take medication as directed on the label ** Do not exceed 4000 mg of tylenol in 24 hours **Do not take medications that contain aspirin or ibuprofen   Prenatal [place] [facility] [place] @ [facility]  [place] [phone]  Center for [facility] @ [place]  [phone]  [place] @ [facility]       [address] [phone]            [facility] [place] @ [place]     [phone]          [facility] [place] @ [facility]   [place] [address] [phone]    [facility] [place] @ [place])  [place]   [phone]     [place] [facility]  Phone: [facility]  Phone: [facility] Phone: [phone]  [facility] Phone: [phone]  [facility] Phone: [phone]  [facility] Phone: [phone]  [facility]  Phone: [phone]

## 2021-07-03 NOTE — MAU Note (Signed)
PT SAYS HAS VOMITED ALL DAY  PT SAYS SHE WAS HERE WED FOR DIZZINESS - GAVE MEDS PHENERGAN- TOOK 9PM- VOMITED

## 2021-07-08 LAB — OB RESULTS CONSOLE RUBELLA ANTIBODY, IGM: Rubella: IMMUNE

## 2021-07-08 LAB — OB RESULTS CONSOLE HEPATITIS B SURFACE ANTIGEN: Hepatitis B Surface Ag: NEGATIVE

## 2021-07-16 ENCOUNTER — Other Ambulatory Visit: Payer: Self-pay

## 2021-07-16 ENCOUNTER — Emergency Department (HOSPITAL_BASED_OUTPATIENT_CLINIC_OR_DEPARTMENT_OTHER)
Admission: EM | Admit: 2021-07-16 | Discharge: 2021-07-17 | Disposition: A | Discharge status: Home / Self Care | Payer: Medicaid Other | Attending: Emergency Medicine | Admitting: Emergency Medicine

## 2021-07-16 DIAGNOSIS — M545 Low back pain, unspecified: Secondary | ICD-10-CM | POA: Insufficient documentation

## 2021-07-16 DIAGNOSIS — Z3A10 10 weeks gestation of pregnancy: Secondary | ICD-10-CM | POA: Insufficient documentation

## 2021-07-16 DIAGNOSIS — Z3491 Encounter for supervision of normal pregnancy, unspecified, first trimester: Secondary | ICD-10-CM

## 2021-07-16 DIAGNOSIS — R109 Unspecified abdominal pain: Secondary | ICD-10-CM

## 2021-07-16 DIAGNOSIS — O9A211 Injury, poisoning and certain other consequences of external causes complicating pregnancy, first trimester: Secondary | ICD-10-CM | POA: Insufficient documentation

## 2021-07-16 DIAGNOSIS — O26891 Other specified pregnancy related conditions, first trimester: Secondary | ICD-10-CM | POA: Insufficient documentation

## 2021-07-16 DIAGNOSIS — Y92410 Unspecified street and highway as the place of occurrence of the external cause: Secondary | ICD-10-CM | POA: Diagnosis not present
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
## 2021-07-16 NOTE — ED Notes (Signed)
ED Provider at bedside. 

## 2021-07-17 NOTE — ED Notes (Signed)
Pt walked up to nurses station requesting to be discharge. Pt calm and cooperative. Explained to pt that we are waiting for the provider to type up discharge paperwork. Pt acknowledged understanding and states "he told me to follow up with my OB. I will look at the rest of my paperwork on My Chart". Pt refusing last set of discharge vital signs.

## 2021-07-17 NOTE — Discharge Instructions (Signed)
Return for worsening abdominal pain or vaginal bleeding.  You will hurt worse tomorrow this is normal.  If you are confused and persistently throwing up then please come back to the emergency room to be evaluated.  You can take Tylenol for discomfort.
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
## 2021-12-06 ENCOUNTER — Inpatient Hospital Stay (HOSPITAL_COMMUNITY)
Admission: AD | Admit: 2021-12-06 | Discharge: 2021-12-06 | Disposition: A | Discharge status: Home / Self Care | Payer: Medicaid Other | Attending: Obstetrics and Gynecology | Admitting: Obstetrics and Gynecology

## 2021-12-06 ENCOUNTER — Encounter (HOSPITAL_COMMUNITY): Payer: Self-pay | Admitting: Obstetrics and Gynecology

## 2021-12-06 DIAGNOSIS — B3731 Acute candidiasis of vulva and vagina: Secondary | ICD-10-CM | POA: Diagnosis not present

## 2021-12-06 DIAGNOSIS — O212 Late vomiting of pregnancy: Secondary | ICD-10-CM | POA: Insufficient documentation

## 2021-12-06 DIAGNOSIS — R109 Unspecified abdominal pain: Secondary | ICD-10-CM | POA: Diagnosis not present

## 2021-12-06 DIAGNOSIS — O98813 Other maternal infectious and parasitic diseases complicating pregnancy, third trimester: Secondary | ICD-10-CM | POA: Diagnosis not present

## 2021-12-06 DIAGNOSIS — O23593 Infection of other part of genital tract in pregnancy, third trimester: Secondary | ICD-10-CM | POA: Insufficient documentation

## 2021-12-06 DIAGNOSIS — R350 Frequency of micturition: Secondary | ICD-10-CM | POA: Insufficient documentation

## 2021-12-06 DIAGNOSIS — O26893 Other specified pregnancy related conditions, third trimester: Secondary | ICD-10-CM | POA: Diagnosis present

## 2021-12-06 DIAGNOSIS — N858 Other specified noninflammatory disorders of uterus: Secondary | ICD-10-CM | POA: Diagnosis not present

## 2021-12-06 DIAGNOSIS — Z3A30 30 weeks gestation of pregnancy: Secondary | ICD-10-CM | POA: Insufficient documentation

## 2021-12-06 DIAGNOSIS — E860 Dehydration: Secondary | ICD-10-CM | POA: Insufficient documentation

## 2021-12-06 DIAGNOSIS — O99283 Endocrine, nutritional and metabolic diseases complicating pregnancy, third trimester: Secondary | ICD-10-CM | POA: Diagnosis not present

## 2021-12-06 DIAGNOSIS — O99280 Endocrine, nutritional and metabolic diseases complicating pregnancy, unspecified trimester: Secondary | ICD-10-CM

## 2021-12-06 LAB — WET PREP, GENITAL
Clue Cells Wet Prep HPF POC: NONE SEEN
Sperm: NONE SEEN
Trich, Wet Prep: NONE SEEN
WBC, Wet Prep HPF POC: <10 (ref <10)
Yeast Wet Prep HPF POC: NONE SEEN

## 2021-12-06 LAB — URINALYSIS, ROUTINE W REFLEX MICROSCOPIC
Bilirubin Urine: NEGATIVE
Glucose, UA: NEGATIVE mg/dL
Hgb urine dipstick: NEGATIVE
Ketones, ur: NEGATIVE mg/dL
Leukocytes,Ua: NEGATIVE
Nitrite: NEGATIVE
Protein, ur: NEGATIVE mg/dL
Specific Gravity, Urine: 1.021 (ref 1.005–1.030)
pH: 8 (ref 5.0–8.0)

## 2021-12-06 MED ORDER — TERCONAZOLE 0.4 % VA CREA: 1.0000 | TOPICAL_CREAM | Freq: Every day | VAGINAL | 0 refills | Status: DC

## 2021-12-06 MED ORDER — LACTATED RINGERS IV BOLUS
1000.0000 mL | Freq: Once | INTRAVENOUS | Status: AC
  Administered 2021-12-06: 1000 mL via INTRAVENOUS

## 2021-12-06 NOTE — MAU Provider Note (Signed)
History     CSN: [id number]  Arrival date and time: 12/06/21 1331   Event Date/Time   First Provider Initiated Contact with Patient 12/06/21 1413      Chief Complaint  Patient presents with   Abdominal Pain   Vaginal Discharge   Emesis   Nausea   Urinary Frequency   HPI  Heather Hopkins is a 21 y.o. G2P0010 at [redacted]w[redacted]d who presents for evaluation of abdominal pain. Patient reports she felt fine when she got up today so she went to work. She reports she tried to eat some grapes at work and threw them up. She reports feeling nauseous after she vomited. She then had some hot chips on the way to MAU that she was able to keep down.  She has not had anything else to eat or drink today.   She reports having menstrual cramps since her arrival to work today. Patient rates the pain as a 4/10 and has not tried anything for the pain. She reports an increase in thick clumpy white discharge. She denies any vaginal bleeding and leaking of fluid. Denies any constipation, diarrhea or any urinary complaints. Reports normal fetal movement.   OB History     Gravida  2   Para      Term      Preterm      AB  1   Living         SAB  1   IAB      Ectopic      Multiple      Live Births              Past Medical History:  Diagnosis Date   Asthma     Past Surgical History:  Procedure Laterality Date   NO PAST SURGERIES      Family History  Problem Relation Age of Onset   Lupus Mother    Hypertension Father     Social History   Tobacco Use   Smoking status: Former    Types: Cigars   Smokeless tobacco: Never  Vaping Use   Vaping Use: Never used  Substance Use Topics   Alcohol use: Not Currently   Drug use: Not Currently    Types: Marijuana    Comment: Last smoked in may 23    Allergies: No Known Allergies  Medications Prior to Admission  Medication Sig Dispense Refill Last Dose   Prenatal Vit-Fe Fumarate-FA (PRENATAL MULTIVITAMIN) TABS tablet Take 1 tablet by  mouth daily at 12 noon.   Past Month   acetaminophen (TYLENOL) 325 MG tablet Take 2 tablets (650 mg total) by mouth every 6 (six) hours as needed. 30 tablet 0    clotrimazole (GYNE-LOTRIMIN) 1 % vaginal cream Place 1 Applicatorful vaginally at bedtime. 45 g 0    metoCLOPramide (REGLAN) 10 MG tablet Take 1 tablet (10 mg total) by mouth 3 (three) times daily with meals as needed for nausea. 60 tablet 2    metroNIDAZOLE (FLAGYL) 500 MG tablet Take 1 tablet (500 mg total) by mouth 2 (two) times daily. 14 tablet 0    ondansetron (ZOFRAN-ODT) 8 MG disintegrating tablet Take 1 tablet (8 mg total) by mouth every 8 (eight) hours as needed for nausea or vomiting. 20 tablet 0    pantoprazole (PROTONIX) 20 MG tablet Take 1 tablet (20 mg total) by mouth daily. 30 tablet 0    promethazine (PHENERGAN) 12.5 MG tablet Take 1-2 tablets (12.5-25 mg total) by mouth every 6 (six)  hours as needed for nausea or vomiting. 30 tablet 2     Review of Systems  Constitutional: Negative.  Negative for fatigue and fever.  HENT: Negative.    Respiratory: Negative.  Negative for shortness of breath.   Cardiovascular: Negative.  Negative for chest pain.  Gastrointestinal:  Positive for abdominal pain. Negative for constipation, diarrhea, nausea and vomiting.  Genitourinary:  Positive for vaginal discharge. Negative for dysuria and vaginal bleeding.  Neurological: Negative.  Negative for dizziness and headaches.   Physical Exam   Blood pressure 108/61, pulse 89, temperature 97.6 F (36.4 C), temperature source Oral, resp. rate 18, height 5\' 2"  (1.575 m), weight 53 kg, last menstrual period 04/25/2021, SpO2 99 %.  Patient Vitals for the past 24 hrs:  BP Temp Temp src Pulse Resp SpO2 Height Weight  12/06/21 1503 108/61 -- -- 89 18 99 % -- --  12/06/21 1355 (!) 109/56 97.6 F (36.4 C) Oral 90 17 100 % 5\' 2"  (1.575 m) 53 kg    Physical Exam Vitals and nursing note reviewed.  Constitutional:      General: She is not in  acute distress.    Appearance: She is well-developed.  HENT:     Head: Normocephalic.  Eyes:     Pupils: Pupils are equal, round, and reactive to light.  Cardiovascular:     Rate and Rhythm: Normal rate and regular rhythm.     Heart sounds: Normal heart sounds.  Pulmonary:     Effort: Pulmonary effort is normal. No respiratory distress.     Breath sounds: Normal breath sounds.  Abdominal:     General: Bowel sounds are normal. There is no distension.     Palpations: Abdomen is soft.     Tenderness: There is no abdominal tenderness.  Genitourinary:    Vagina: Vaginal discharge present.     Comments: Thick, clumpy white discharge Skin:    General: Skin is warm and dry.  Neurological:     Mental Status: She is alert and oriented to person, place, and time.  Psychiatric:        Mood and Affect: Mood normal.        Behavior: Behavior normal.        Thought Content: Thought content normal.        Judgment: Judgment normal.     Fetal Tracing:  Baseline:  130 Variability: moderate Accels: 10x10 Decels: none  Toco: none  Dilation: Closed Exam by:: , CNM   MAU Course  Procedures  Results for orders placed or performed during the hospital encounter of 12/06/21 (from the past 24 hour(s))  Wet prep, genital     Status: None   Collection Time: 12/06/21  2:20 PM   Specimen: Vaginal  Result Value Ref Range   Yeast Wet Prep HPF POC NONE SEEN NONE SEEN   Trich, Wet Prep NONE SEEN NONE SEEN   Clue Cells Wet Prep HPF POC NONE SEEN NONE SEEN   WBC, Wet Prep HPF POC <10 <10   Sperm NONE SEEN      MDM Prenatal records from community office reviewed. Pregnancy uncomlicated. Labs ordered and reviewed.   UA- patient unable to leave sample until after IV fluids Wet prep and gc/chlamydia LR bolus  Discharge consistent with yeast on physical exam- treatment options discussed  CNM emphasized importance of proper PO hydration in pregnancy  Assessment and Plan    1. Dehydration during pregnancy   2. Uterine irritability   3. [redacted] weeks  gestation of pregnancy   4. Candidiasis of vagina during pregnancy     -Discharge home in stable condition -Rx for terazol sent to patient's pharmacy -Preterm labor precautions discussed -Patient advised to follow-up with OB as scheduled for prenatal care -Patient may return to MAU as needed or if her condition were to change or worsen  Rolm Bookbinder, CNM 12/06/2021, 3:13 PM

## 2021-12-06 NOTE — MAU Note (Signed)
Heather Hopkins is a 21 y.o. at [redacted]w[redacted]d here in MAU reporting: when she woke up this morning she was fine.  When she got to work, she started having cramping.  Having to pee a lot of more than usual (no pain). D/c is heavy, white thick and clumpy, no odor. No bleeding. Is nauseous, has thrown up.  Reports +FM  Onset of complaint: this morning Pain score: 4 1/2 Vitals:   12/06/21 1355  BP: (!) 109/56  Pulse: 90  Resp: 17  Temp: 97.6 F (36.4 C)  SpO2: 100%     FHT:130 Lab orders placed from triage:  urine

## 2021-12-06 NOTE — Discharge Instructions (Signed)

## 2021-12-07 LAB — GC/CHLAMYDIA PROBE AMP (~~LOC~~) NOT AT ARMC
Chlamydia: NEGATIVE
Comment: NEGATIVE
Comment: NORMAL
Neisseria Gonorrhea: NEGATIVE

## 2022-01-13 LAB — OB RESULTS CONSOLE GBS: GBS: NEGATIVE

## 2022-01-18 ENCOUNTER — Inpatient Hospital Stay (HOSPITAL_COMMUNITY)
Admission: AD | Admit: 2022-01-18 | Discharge: 2022-01-18 | Disposition: A | Discharge status: Home / Self Care | Payer: Medicaid Other | Attending: Obstetrics and Gynecology | Admitting: Obstetrics and Gynecology

## 2022-01-18 ENCOUNTER — Encounter (HOSPITAL_COMMUNITY): Payer: Self-pay | Admitting: Obstetrics and Gynecology

## 2022-01-18 DIAGNOSIS — Z3A36 36 weeks gestation of pregnancy: Secondary | ICD-10-CM | POA: Diagnosis not present

## 2022-01-18 DIAGNOSIS — O4703 False labor before 37 completed weeks of gestation, third trimester: Secondary | ICD-10-CM

## 2022-01-18 NOTE — MAU Note (Signed)
Pt says UC strong since [address] [facility]- Dr Mindi Slicker VE- closed Denies HSV GBS_ neg

## 2022-01-18 NOTE — MAU Provider Note (Signed)
Ms. Heather Hopkins is a G2P0010 at [redacted]w[redacted]d seen in MAU for labor. RN labor check, not seen by provider. SVE by RN Dilation: 2.5 Effacement (%): 50 Station: Ballotable Presentation: Vertex Exam by:: Agustin Cree, RN   NST - FHR: 130 bpm / moderate variability / accels present / decels absent / TOCO: irregular every 3-8 mins   Plan:  D/C home with labor precautions Keep scheduled appt with [facility]   Raelyn Mora, CNM  01/18/2022 8:35 AM

## 2022-01-18 NOTE — Discharge Instructions (Signed)
2/3-1-1 Rule Go to MAU for painful contractions every 2-3 minutes, lasting 1 minute each for 1.5 hours.  

## 2022-01-20 ENCOUNTER — Encounter (HOSPITAL_COMMUNITY): Payer: Self-pay | Admitting: Obstetrics and Gynecology

## 2022-01-20 ENCOUNTER — Inpatient Hospital Stay (HOSPITAL_COMMUNITY): Payer: Medicaid Other | Admitting: Anesthesiology

## 2022-01-20 ENCOUNTER — Other Ambulatory Visit: Payer: Self-pay

## 2022-01-20 ENCOUNTER — Inpatient Hospital Stay (HOSPITAL_COMMUNITY)
Admission: AD | Admit: 2022-01-20 | Discharge: 2022-01-22 | DRG: 807 | Disposition: A | Discharge status: Home / Self Care | Payer: Medicaid Other | Attending: Obstetrics and Gynecology | Admitting: Obstetrics and Gynecology

## 2022-01-20 DIAGNOSIS — O99324 Drug use complicating childbirth: Secondary | ICD-10-CM | POA: Diagnosis present

## 2022-01-20 DIAGNOSIS — F1290 Cannabis use, unspecified, uncomplicated: Secondary | ICD-10-CM | POA: Diagnosis present

## 2022-01-20 DIAGNOSIS — Z3A37 37 weeks gestation of pregnancy: Secondary | ICD-10-CM | POA: Diagnosis not present

## 2022-01-20 DIAGNOSIS — Z87891 Personal history of nicotine dependence: Secondary | ICD-10-CM | POA: Diagnosis not present

## 2022-01-20 DIAGNOSIS — O26893 Other specified pregnancy related conditions, third trimester: Secondary | ICD-10-CM | POA: Diagnosis present

## 2022-01-20 LAB — CBC
HCT: 36 % (ref 36.0–46.0)
Hemoglobin: 11.6 g/dL — ABNORMAL LOW (ref 12.0–15.0)
MCH: 29.7 pg (ref 26.0–34.0)
MCHC: 32.2 g/dL (ref 30.0–36.0)
MCV: 92.1 fL (ref 80.0–100.0)
Platelets: 333 10*3/uL (ref 150–400)
RBC: 3.91 MIL/uL (ref 3.87–5.11)
RDW: 14 % (ref 11.5–15.5)
WBC: 13.6 10*3/uL — ABNORMAL HIGH (ref 4.0–10.5)
nRBC: 0 % (ref 0.0–0.2)

## 2022-01-20 LAB — TYPE AND SCREEN
ABO/RH(D): O POS
Antibody Screen: NEGATIVE

## 2022-01-20 LAB — RPR: RPR Ser Ql: NONREACTIVE

## 2022-01-20 MED ORDER — SIMETHICONE 80 MG PO CHEW: 80.0000 mg | CHEWABLE_TABLET | ORAL | Status: DC | PRN

## 2022-01-20 MED ORDER — PRENATAL MULTIVITAMIN CH
1.0000 | ORAL_TABLET | Freq: Every day | ORAL | Status: DC
  Administered 2022-01-21 – 2022-01-22 (×2): 1 via ORAL
  Filled 2022-01-20 (×2): qty 1

## 2022-01-20 MED ORDER — EPHEDRINE 5 MG/ML INJ: 10.0000 mg | INTRAVENOUS | Status: DC | PRN

## 2022-01-20 MED ORDER — DIBUCAINE (PERIANAL) 1 % EX OINT: 1.0000 | TOPICAL_OINTMENT | CUTANEOUS | Status: DC | PRN

## 2022-01-20 MED ORDER — PHENYLEPHRINE 80 MCG/ML (10ML) SYRINGE FOR IV PUSH (FOR BLOOD PRESSURE SUPPORT): 80.0000 ug | PREFILLED_SYRINGE | INTRAVENOUS | Status: DC | PRN

## 2022-01-20 MED ORDER — DIPHENHYDRAMINE HCL 25 MG PO CAPS: 25.0000 mg | ORAL_CAPSULE | Freq: Four times a day (QID) | ORAL | Status: DC | PRN

## 2022-01-20 MED ORDER — SOD CITRATE-CITRIC ACID 500-334 MG/5ML PO SOLN: 30.0000 mL | ORAL | Status: DC | PRN

## 2022-01-20 MED ORDER — ONDANSETRON HCL 4 MG/2ML IJ SOLN
4.0000 mg | Freq: Four times a day (QID) | INTRAMUSCULAR | Status: DC | PRN
  Administered 2022-01-20: 4 mg via INTRAVENOUS
  Filled 2022-01-20: qty 2

## 2022-01-20 MED ORDER — LIDOCAINE HCL (PF) 1 % IJ SOLN
INTRAMUSCULAR | Status: DC | PRN
  Administered 2022-01-20: 5 mL via EPIDURAL

## 2022-01-20 MED ORDER — OXYCODONE HCL 5 MG PO TABS: 10.0000 mg | ORAL_TABLET | ORAL | Status: DC | PRN

## 2022-01-20 MED ORDER — COCONUT OIL OIL: 1.0000 | TOPICAL_OIL | Status: DC | PRN

## 2022-01-20 MED ORDER — DIPHENHYDRAMINE HCL 50 MG/ML IJ SOLN: 12.5000 mg | INTRAMUSCULAR | Status: DC | PRN

## 2022-01-20 MED ORDER — ZOLPIDEM TARTRATE 5 MG PO TABS: 5.0000 mg | ORAL_TABLET | Freq: Every evening | ORAL | Status: DC | PRN

## 2022-01-20 MED ORDER — SENNOSIDES-DOCUSATE SODIUM 8.6-50 MG PO TABS
2.0000 | ORAL_TABLET | Freq: Every day | ORAL | Status: DC
  Administered 2022-01-21 – 2022-01-22 (×2): 2 via ORAL
  Filled 2022-01-20 (×2): qty 2

## 2022-01-20 MED ORDER — OXYTOCIN BOLUS FROM INFUSION
333.0000 mL | Freq: Once | INTRAVENOUS | Status: AC
  Administered 2022-01-20: 333 mL via INTRAVENOUS

## 2022-01-20 MED ORDER — ONDANSETRON HCL 4 MG/2ML IJ SOLN: 4.0000 mg | INTRAMUSCULAR | Status: DC | PRN

## 2022-01-20 MED ORDER — ONDANSETRON HCL 4 MG PO TABS: 4.0000 mg | ORAL_TABLET | ORAL | Status: DC | PRN

## 2022-01-20 MED ORDER — LACTATED RINGERS IV SOLN
500.0000 mL | Freq: Once | INTRAVENOUS | Status: AC
  Administered 2022-01-20: 500 mL via INTRAVENOUS

## 2022-01-20 MED ORDER — OXYTOCIN-SODIUM CHLORIDE 30-0.9 UT/500ML-% IV SOLN
2.5000 [IU]/h | INTRAVENOUS | Status: DC
  Administered 2022-01-20: 2.5 [IU]/h via INTRAVENOUS
  Filled 2022-01-20: qty 500

## 2022-01-20 MED ORDER — ACETAMINOPHEN 325 MG PO TABS
650.0000 mg | ORAL_TABLET | ORAL | Status: DC | PRN
  Administered 2022-01-21 – 2022-01-22 (×5): 650 mg via ORAL
  Filled 2022-01-20 (×5): qty 2

## 2022-01-20 MED ORDER — OXYCODONE HCL 5 MG PO TABS
5.0000 mg | ORAL_TABLET | ORAL | Status: DC | PRN
  Administered 2022-01-21 – 2022-01-22 (×2): 5 mg via ORAL
  Filled 2022-01-20 (×2): qty 1

## 2022-01-20 MED ORDER — BENZOCAINE-MENTHOL 20-0.5 % EX AERO
1.0000 | INHALATION_SPRAY | CUTANEOUS | Status: DC | PRN
  Administered 2022-01-21: 1 via TOPICAL
  Filled 2022-01-20: qty 56

## 2022-01-20 MED ORDER — OXYCODONE-ACETAMINOPHEN 5-325 MG PO TABS: 2.0000 | ORAL_TABLET | ORAL | Status: DC | PRN

## 2022-01-20 MED ORDER — IBUPROFEN 600 MG PO TABS
600.0000 mg | ORAL_TABLET | Freq: Four times a day (QID) | ORAL | Status: DC
  Administered 2022-01-20 – 2022-01-22 (×9): 600 mg via ORAL
  Filled 2022-01-20 (×9): qty 1

## 2022-01-20 MED ORDER — LIDOCAINE HCL (PF) 1 % IJ SOLN: 30.0000 mL | INTRAMUSCULAR | Status: DC | PRN

## 2022-01-20 MED ORDER — FLEET ENEMA 7-19 GM/118ML RE ENEM: 1.0000 | ENEMA | Freq: Every day | RECTAL | Status: DC | PRN

## 2022-01-20 MED ORDER — FENTANYL-BUPIVACAINE-NACL 0.5-0.125-0.9 MG/250ML-% EP SOLN
EPIDURAL | Status: DC | PRN
  Administered 2022-01-20: 12 mL/h via EPIDURAL

## 2022-01-20 MED ORDER — ACETAMINOPHEN 325 MG PO TABS
650.0000 mg | ORAL_TABLET | ORAL | Status: DC | PRN
  Administered 2022-01-20: 650 mg via ORAL
  Filled 2022-01-20: qty 2

## 2022-01-20 MED ORDER — FENTANYL CITRATE (PF) 100 MCG/2ML IJ SOLN
50.0000 ug | INTRAMUSCULAR | Status: DC | PRN
  Administered 2022-01-20: 100 ug via INTRAVENOUS
  Filled 2022-01-20: qty 2

## 2022-01-20 MED ORDER — TETANUS-DIPHTH-ACELL PERTUSSIS 5-2.5-18.5 LF-MCG/0.5 IM SUSY: 0.5000 mL | PREFILLED_SYRINGE | Freq: Once | INTRAMUSCULAR | Status: DC

## 2022-01-20 MED ORDER — LACTATED RINGERS IV SOLN: INTRAVENOUS | Status: DC

## 2022-01-20 MED ORDER — OXYCODONE-ACETAMINOPHEN 5-325 MG PO TABS: 1.0000 | ORAL_TABLET | ORAL | Status: DC | PRN

## 2022-01-20 MED ORDER — FENTANYL-BUPIVACAINE-NACL 0.5-0.125-0.9 MG/250ML-% EP SOLN
12.0000 mL/h | EPIDURAL | Status: DC | PRN
  Filled 2022-01-20: qty 250

## 2022-01-20 MED ORDER — WITCH HAZEL-GLYCERIN EX PADS: 1.0000 | MEDICATED_PAD | CUTANEOUS | Status: DC | PRN

## 2022-01-20 MED ORDER — LACTATED RINGERS IV SOLN: 500.0000 mL | INTRAVENOUS | Status: DC | PRN

## 2022-01-20 NOTE — H&P (Signed)
21 y.o. [redacted]w[redacted]d  G2P0010 comes in c/o contractions.  Was seen yesterday in MAU and sent home without cervical change.  Otherwise has good fetal movement and no bleeding.  Past Medical History:  Diagnosis Date   Asthma     Past Surgical History:  Procedure Laterality Date   NO PAST SURGERIES      OB History  Gravida Para Term Preterm AB Living  2       1    SAB IAB Ectopic Multiple Live Births  1            # Outcome Date GA Lbr Len/2nd Weight Sex Delivery Anes PTL Lv  2 Current           1 SAB      SAB       Social History   Socioeconomic History   Marital status: Single    Spouse name: Not on file   Number of children: Not on file   Years of education: Not on file   Highest education level: Not on file  Occupational History   Not on file  Tobacco Use   Smoking status: Former    Types: Cigars   Smokeless tobacco: Never  Vaping Use   Vaping Use: Never used  Substance and Sexual Activity   Alcohol use: Not Currently   Drug use: Not Currently    Types: Marijuana    Comment: Last smoked in may 23   Sexual activity: Yes    Birth control/protection: None  Other Topics Concern   Not on file  Social History Narrative   Not on file   Social Determinants of Health   Financial Resource Strain: Not on file  Food Insecurity: Not on file  Transportation Needs: Not on file  Physical Activity: Not on file  Stress: Not on file  Social Connections: Not on file  Intimate Partner Violence: Not on file   Patient has no known allergies.    Prenatal Transfer Tool  Maternal Diabetes: No Genetic Screening: Normal Maternal Ultrasounds/Referrals: Normal Fetal Ultrasounds or other Referrals:  None Maternal Substance Abuse:  No Significant Maternal Medications:  None Significant Maternal Lab Results: Group B Strep negative  Other PNC: anxiety/depression no meds, S<D at last visit, was scheduled for growth scan    Vitals:   01/20/22 0720 01/20/22 0731 01/20/22 0744  BP:  112/71 116/89 117/62  Pulse: 74 80 76  Resp: 18 18 18   Temp: 97.6 F (36.4 C)    TempSrc: Oral    SpO2: 100% 100%   Weight: 56.5 kg    Height: 5\' 1"  (1.549 m)      Lungs/Cor:  NAD Abdomen:  soft, gravid Ex:  no cords, erythema SVE:  5/90/-2 FHTs:  125, good STV, NST R; Cat 1 tracing. Toco:  q 2-4   A/P   Admit 37.0 labor  GBS Neg per office record  Plan for AROM, pitocin as needed  Other routine care  

## 2022-01-20 NOTE — Lactation Note (Signed)
This note was copied from a baby's chart. Lactation Consultation Note  Patient Name: Heather Hopkins [id number] Date: 01/20/2022 Reason for consult: L&D Initial assessment;Primapara;1st time breastfeeding;Early term 37-38.6wks Age:20 hours   Initial L&D Consult:  Visited with family < 1 hour after birth RN working with baby; baby is congested and she was using bulb syringe to assist with removing fluid.  Attempted to latch, however, he was not interested.  Birth parent already has a bottle of formula at bedside.  Suggested holding him STS and he lay quietly on her chest.  Reassured her that lactation services will be available on the M/B unit.  Support person outside at this time.   Maternal Data    Feeding Mother's Current Feeding Choice: Breast Milk and Formula  LATCH Score Latch: Too sleepy or reluctant, no latch achieved, no sucking elicited.  Audible Swallowing: None  Type of Nipple: Everted at rest and after stimulation  Comfort (Breast/Nipple): Soft / non-tender  Hold (Positioning): Assistance needed to correctly position infant at breast and maintain latch.  LATCH Score: 5   Lactation Tools Discussed/Used    Interventions Interventions: Assisted with latch;Skin to skin  Discharge    Consult Status Consult Status: Follow-up from L&D    [name] R Timm Bonenberger 01/20/2022, 6:08 PM

## 2022-01-20 NOTE — MAU Note (Signed)
Heather Hopkins is a 21 y.o. at [redacted]w[redacted]d here in MAU reporting: been contracting for 3 days. Have gotten closer, now q 5-54min. No bleeding or leaking. Some d/c.  Freq urination. Lost her plug, reports +FM.  Was 2 cm when last checked.   Onset of complaint: 3days ago Pain score: 8 Vitals:   01/20/22 0720  BP: 112/71  Pulse: 74  Resp: 18  Temp: 97.6 F (36.4 C)  SpO2: 100%     FHT:144 Lab orders placed from triage:  none

## 2022-01-20 NOTE — Anesthesia Preprocedure Evaluation (Signed)
Anesthesia Evaluation  Patient identified by MRN, date of birth, ID band Patient awake    Reviewed: Allergy & Precautions, NPO status , Patient's Chart, lab work & pertinent test results  Airway Mallampati: II  TM Distance: >3 FB Neck ROM: Full    Dental no notable dental hx. (+) Teeth Intact, Dental Advisory Given   Pulmonary former smoker,    Pulmonary exam normal breath sounds clear to auscultation       Cardiovascular Exercise Tolerance: Good Normal cardiovascular exam Rhythm:Regular Rate:Normal     Neuro/Psych negative psych ROS   GI/Hepatic negative GI ROS, Neg liver ROS,   Endo/Other  negative endocrine ROS  Renal/GU negative Renal ROS     Musculoskeletal negative musculoskeletal ROS (+)   Abdominal   Peds  Hematology Lab Results      Component                Value               Date                      WBC                      13.6 (H)            01/20/2022                HGB                      11.6 (L)            01/20/2022                HCT                      36.0                01/20/2022                MCV                      92.1                01/20/2022                PLT                      333                 01/20/2022              Anesthesia Other Findings   Reproductive/Obstetrics (+) Pregnancy                             Anesthesia Physical Anesthesia Plan  ASA: 2  Anesthesia Plan: Epidural   Post-op Pain Management:    Induction:   PONV Risk Score and Plan:   Airway Management Planned:   Additional Equipment:   Intra-op Plan:   Post-operative Plan:   Informed Consent: I have reviewed the patients History and Physical, chart, labs and discussed the procedure including the risks, benefits and alternatives for the proposed anesthesia with the patient or authorized representative who has indicated his/her understanding and acceptance.        Plan Discussed with:   Anesthesia Plan Comments: (37 wk G2P0 for LEA)  Anesthesia Quick Evaluation  

## 2022-01-20 NOTE — Anesthesia Procedure Notes (Addendum)
Epidural Patient location during procedure: OB Start time: 01/20/2022 11:15 AM End time: 01/20/2022 11:30 AM  Staffing Anesthesiologist: Trevor Iha, MD Performed: anesthesiologist   Preanesthetic Checklist Completed: patient identified, IV checked, site marked, risks and benefits discussed, surgical consent, monitors and equipment checked, pre-op evaluation and timeout performed  Epidural Patient position: sitting Prep: DuraPrep and site prepped and draped Patient monitoring: continuous pulse ox and blood pressure Approach: midline Location: L2-L3 Injection technique: LOR air  Needle:  Needle type: Tuohy  Needle gauge: 17 G Needle length: 9 cm and 9 Needle insertion depth: 6 cm Catheter type: closed end flexible Catheter size: 19 Gauge Catheter at skin depth: 11 cm Test dose: negative  Assessment Events: blood not aspirated, injection not painful, no injection resistance, no paresthesia and negative IV test  Additional Notes Patient identified. Risks/Benefits/Options discussed with patient including but not limited to bleeding, infection, nerve damage, paralysis, failed block, incomplete pain control, headache, blood pressure changes, nausea, vomiting, reactions to medication both or allergic, itching and postpartum back pain. Confirmed with bedside nurse the patient's most recent platelet count. Confirmed with patient that they are not currently taking any anticoagulation, have any bleeding history or any family history of bleeding disorders. Patient expressed understanding and wished to proceed. All questions were answered. Sterile technique was used throughout the entire procedure. Please see nursing notes for vital signs. Test dose was given through epidural needle and negative prior to continuing to dose epidural or start infusion. Warning signs of high block given to the patient including shortness of breath, tingling/numbness in hands, complete motor block, or any  concerning symptoms with instructions to call for help. Patient was given instructions on fall risk and not to get out of bed. All questions and concerns addressed with instructions to call with any issues.  1 Attempt (S) . Patient tolerated procedure well.

## 2022-01-21 LAB — CBC
HCT: 27.4 % — ABNORMAL LOW (ref 36.0–46.0)
Hemoglobin: 9.3 g/dL — ABNORMAL LOW (ref 12.0–15.0)
MCH: 30.4 pg (ref 26.0–34.0)
MCHC: 33.9 g/dL (ref 30.0–36.0)
MCV: 89.5 fL (ref 80.0–100.0)
Platelets: 258 10*3/uL (ref 150–400)
RBC: 3.06 MIL/uL — ABNORMAL LOW (ref 3.87–5.11)
RDW: 14 % (ref 11.5–15.5)
WBC: 14.4 10*3/uL — ABNORMAL HIGH (ref 4.0–10.5)
nRBC: 0 % (ref 0.0–0.2)

## 2022-01-21 NOTE — Lactation Note (Addendum)
This note was copied from a baby's chart. Lactation Consultation Note  Patient Name: Heather Hopkins FUXNA'T Date: 01/21/2022 Reason for consult: Initial assessment;1st time breastfeeding;Primapara;Early term 37-38.6wks;Infant < 6lbs Age:20 hours  LC in to visit with P1 Mom of ET infant delivered vaginally.  Baby weighed 5 lbs 10 oz and was 37 weeks.  Baby hasn't been latching to the breast, but attempts have been made.  Baby has had 4 formula feedings of 22 cal by extra slow flow nipple.  Volumes of 2-10 ml.  Baby was just finishing up a feeding and Mom was saying baby wouldn't take the bottle.  5 ml from bottle taken.  Assisted with paced bottle feeding another 5 ml and showed how to have nipple touch roof of baby's mouth to stimulate suck.  Baby able to take another 34ml without any gagging or spillage noted.  Assisted with upright burping.  LC set up DEBP as Mom was interested in supporting her milk supply. Assisted with first pumping using 24 mm flanges.  Talked about finger feed, syringe feeding with a finger, and spoon feeding drops of colostrum.    Baby started cueing and offered to assist with latching.  Colostrum drops dripping from nipples.  Baby placed STS in football hold on left breast.  Mom assisted to support baby's head, and support/sandwich breast, baby opened onto nipple only and LC adjust mouth and baby slipped off a few times.  Once he became sleepy, handed him to FOB and Mom continued double pumping.  LPTI guidelines reviewed and supplementation amounts pointed out. Today 5-10 ml every 3 hrs would be minimal amount increasing volume as indicated each day.    Plan recommended- 1- STS with baby as much as possible 2- Offer the breast with feeding cues, asking for help prn 3- At least every 3 hrs, supplement baby with 22 cal formula per volume guidelines for LPTI 4-pump both breasts 15 mins on initiation setting.  Mom does not have a DEBP.  WIC referral made. Encouraged to  call for help prn.  Maternal Data Has patient been taught Hand Expression?: Yes Does the patient have breastfeeding experience prior to this delivery?: No  Feeding Mother's Current Feeding Choice: Breast Milk and Formula Nipple Type: Extra Slow Flow  LATCH Score Latch: Too sleepy or reluctant, no latch achieved, no sucking elicited.  Audible Swallowing: None  Type of Nipple: Everted at rest and after stimulation  Comfort (Breast/Nipple): Soft / non-tender  Hold (Positioning): Full assist, staff holds infant at breast  LATCH Score: 4   Lactation Tools Discussed/Used Tools: Pump;Flanges;Bottle Breast pump type: Double-Electric Breast Pump;Manual Pump Education: Setup, frequency, and cleaning;Milk Storage Reason for Pumping: Support milk supply/ET infant/<6lbs Pumping frequency: Encouraged Mom to consistently pump every 2-3 hrs during the day and 3-4 hrs at night  Interventions Interventions: Breast feeding basics reviewed;Assisted with latch;Skin to skin;Breast massage;Hand express;Support pillows;Position options;DEBP;Education;LC Services brochure;LPT handout/interventions  Discharge WIC Program: Yes  Consult Status Consult Status: Follow-up Date: 01/22/22    Heather Hopkins 01/21/2022, 9:57 AM

## 2022-01-21 NOTE — Clinical Social Work Maternal (Signed)
CLINICAL SOCIAL WORK MATERNAL/CHILD NOTE   Patient Details  Name: Heather Hopkins MRN: [id number] Date of Birth: 03/18/2001   Date:  01/21/2022   Clinical Social Worker Initiating Note:  Alix Lahmann, LCSWA   Date/Time: Initiated:  01/21/22/1232              Child's Name:  Heather Hopkins    Biological Parents:  Mother, Father (Winnell Mcdonagh 06/29/2001, Jowaanen Hopkins 02/21/1996)    Need for Interpreter:  None    Reason for Referral:  Current Substance Use/Substance Use During Pregnancy      Address:  [address] [place] [address]    Phone number:  [phone] (home)      Additional phone number:    Household Members/Support Persons (HM/SP):   Household Member/Support Person 1     HM/SP Name Relationship DOB or Age  HM/SP -1 Jawaanen Hopkins Significant other 02/21/1996  HM/SP -2     HM/SP -3     HM/SP -4     HM/SP -5     HM/SP -6     HM/SP -7     HM/SP -8         Natural Supports (not living in the home):  Parent    Professional Supports:      Employment: Full-time    Type of Work: [employer]    Education:  Some College    Homebound arranged:     Financial Resources:  Medicaid    Other Resources:  Food Stamps  , WIC    Cultural/Religious Considerations Which May Impact Care:     Strengths:  Ability to meet basic needs  , Home prepared for child  , Pediatrician chosen    Psychotropic Medications:          Pediatrician:    [place] area   Pediatrician List:    [facility]  [place]   [place]   [place]   [place]   [place]       Pediatrician Fax Number:     Risk Factors/Current Problems:  Substance Use      Cognitive State:  Alert  , Goal Oriented      Mood/Affect:  Comfortable  , Calm      CSW Assessment: CSW received consult for THC use during pregnancy.  CSW met with [name] to offer support and complete assessment. CSW entered the room, introduced self, CSW role and reason for visit.  [name] was agreeable to visit. CSW inquired about how [name] was feeling, [name] reported she was tired, explained she labored for 3 days and it was painful. CSW actively listened while [name] explored her labor and delivery process. CSW inquired about noted THC use during pregnancy. [name] stated she used throughout her pregnancy to help her eat and reduce the nausea. [name] reported she was forthcoming with her OBGYN and she knew about the substance use. [name] reported her last THC use was in May. CSW explained the Hospital Drug Screen policy and notified [name] that infants urine came back negative. CSW explained that the CDS would be followed and if it is positive for any substance a CPS report would be made. [name] voiced understanding.    CSW inquired about and MH concerns. [name] stated she has experienced some depression in the past but none throughout her pregnancy. CSW inquired about treatment. [name] stated " It didn't get that bad". CSW assessed for safety, [name] denied any SI or HI. [name] reported her supports are   FOB, her grandmother and her father. CSW provided education regarding the baby blues period vs. perinatal mood disorders, discussed treatment and gave resources for mental health follow up if concerns arise.  CSW recommends self-evaluation during the postpartum time period using the New Mom Checklist from Postpartum Progress and encouraged [name] to contact a medical professional if symptoms are noted at any time. CSW provided review of Sudden Infant Death Syndrome (SIDS) precautions.       CSW Plan/Description:  Sudden Infant Death Syndrome (SIDS) Education, CSW Will Continue to Monitor Umbilical Cord Tissue Drug Screen Results and Make Report if Warranted, Hospital Drug Screen Policy Information, Perinatal Mood and Anxiety Disorder (PMADs) Education, No Further Intervention Required/No Barriers to Discharge, Other Information/Referral to Community Resources      Romaldo Saville M Maximilliano Kersh, LCSW 01/21/2022, 2:07 PM 

## 2022-01-21 NOTE — Anesthesia Postprocedure Evaluation (Signed)
Anesthesia Post Note  Patient: Heather Hopkins  Procedure(s) Performed: AN AD HOC LABOR EPIDURAL     Patient location during evaluation: Mother Baby Anesthesia Type: Epidural Level of consciousness: awake and alert Pain management: pain level controlled Vital Signs Assessment: post-procedure vital signs reviewed and stable Respiratory status: spontaneous breathing, nonlabored ventilation and respiratory function stable Cardiovascular status: stable Postop Assessment: no headache, no backache, epidural receding, no apparent nausea or vomiting, patient able to bend at knees, adequate PO intake and able to ambulate Anesthetic complications: no   No notable events documented.  Last Vitals:  Vitals:   01/21/22 0012 01/21/22 0445  BP: 106/60 (!) 102/56  Pulse: 64 75  Resp:  16  Temp: 36.9 C 36.6 C  SpO2: 100% 100%    Last Pain:  Vitals:   01/21/22 0802  TempSrc:   PainSc: 5    Pain Goal:                   Laban Emperor

## 2022-01-21 NOTE — Progress Notes (Addendum)
Post Partum Day 1 Subjective: up ad lib, voiding, tolerating PO, + flatus, and lochia mild. She reports back pain not well relieved with ibuprofen. Working on breastfeeding; pumping now. Bonding well with baby. She denies HA, CP, SOB or CP. Desires circumcision for baby   Objective: Blood pressure 107/76, pulse 72, temperature 98.2 F (36.8 C), temperature source Oral, resp. rate 18, height 5\' 1"  (1.549 m), weight 56.5 kg, last menstrual period 04/25/2021, SpO2 100 %, unknown if currently breastfeeding.  Physical Exam:  General: alert, cooperative, and no distress Lochia: appropriate Uterine Fundus: firm Incision: n/a DVT Evaluation: No evidence of DVT seen on physical exam.  Recent Labs    01/20/22 1020 01/21/22 0546  HGB 11.6* 9.3*  HCT 36.0 27.4*    Assessment/Plan: Plan for discharge tomorrow, Breastfeeding, and Circumcision prior to discharge Routine pp care  Pain control prn    LOS: 1 day   Heather Hopkins W Heather Reinhold, DO 01/21/2022, 12:36 PM

## 2022-01-22 MED ORDER — ACETAMINOPHEN 325 MG PO TABS: 650.0000 mg | ORAL_TABLET | Freq: Four times a day (QID) | ORAL | Status: AC | PRN

## 2022-01-22 MED ORDER — IBUPROFEN 600 MG PO TABS: 600.0000 mg | ORAL_TABLET | Freq: Four times a day (QID) | ORAL | 1 refills | Status: AC | PRN

## 2022-01-22 MED ORDER — OXYCODONE HCL 5 MG PO TABS
5.0000 mg | ORAL_TABLET | ORAL | 0 refills | Status: DC | PRN
Start: 2022-01-22 — End: 2023-08-31

## 2022-01-22 NOTE — Lactation Note (Addendum)
This note was copied from a baby's chart. Lactation Consultation Note  Patient Name: Heather Hopkins Date: 01/22/2022 Reason for consult: Follow-up assessment;Mother's request;Exclusive pumping and bottle feeding;Early term 37-38.6wks;Infant < 6lbs;Breastfeeding assistance Age:20 hours  Birthparent had speech consult for baby feeding with Dr. Irving Burton preemie nipple. LC assisted with sideline feeding infant took 29 ml.  LC reassessed flange size, birthparent pumping with expression of milk from both breasts.  Plan 1. To feed based on cues 8-12x 24hr period. 2. Birth parent to offer EBM first followed by formula with Dr. Manson Passey preemie nipple feeding 30 ml per feeding 3 Post pump after each feeding for 15 mins.   Birth parent waiting for support person to bring 30 dollars for [facility] loaner, will need it prior to discharge if they go home today.  LC talked with RN and reviewed SLP feeding plan during the visit with birthparent.   Maternal Data Has patient been taught Hand Expression?: Yes  Feeding Mother's Current Feeding Choice: Breast Milk and Formula Nipple Type: Dr. Lorne Skeens  LATCH Score                    Lactation Tools Discussed/Used Tools: Pump;Flanges Flange Size: 24;27 Breast pump type: Double-Electric Breast Pump Pump Education: Setup, frequency, and cleaning;Milk Storage Reason for Pumping: increase stimulation Pumping frequency: post pump after each feeding for 15 mins  Interventions Interventions: Breast feeding basics reviewed;Hand express;Expressed milk;DEBP;Education;Infant Driven Feeding Algorithm education;LC Services brochure;LPT handout/interventions  Discharge Discharge Education: Engorgement and breast care;Warning signs for feeding baby;Outpatient recommendation  Consult Status Consult Status: Complete Date: 01/23/22 Follow-up type: In-patient    Heather Robinson  Hopkins 01/22/2022, 3:16 PM

## 2022-01-22 NOTE — Progress Notes (Signed)
Post Partum Day 2 Subjective: Some cramping, got some relief with ibuprofen/tylenol, took oxycodone 1 times this morning. Ambulating, voiding, tolerating PO. Minimal lochia.   Objective: Patient Vitals for the past 24 hrs:  BP Temp Temp src Pulse Resp SpO2  01/22/22 0530 103/62 98 F (36.7 C) Oral 69 17 100 %  01/21/22 2036 106/65 98.2 F (36.8 C) Oral 77 16 100 %  01/21/22 1629 120/66 97.9 F (36.6 C) Oral 61 16 100 %    Physical Exam:  General: alert, cooperative, and no distress Lochia: appropriate Uterine Fundus: firm DVT Evaluation: No evidence of DVT seen on physical exam.  Recent Labs    01/20/22 1020 01/21/22 0546  WBC 13.6* 14.4*  HGB 11.6* 9.3*  HCT 36.0 27.4*  PLT 333 258    No results for input(s): "NA", "K", "CL", "CO2CT", "BUN", "CREATININE", "GLUCOSE", "BILITOT", "ALT", "AST", "ALKPHOS", "PROT", "ALBUMIN" in the last 72 hours.  No results for input(s): "CALCIUM", "MG", "PHOS" in the last 72 hours.  No results for input(s): "PROTIME", "APTT", "INR" in the last 72 hours.  No results for input(s): "PROTIME", "APTT", "INR", "FIBRINOGEN" in the last 72 hours. Assessment/Plan:  Heather Hopkins 21 y.o. G2P1011 PPD#2 sp SVD 1. PPC: routine PP care 2. Rh pos 3. THC use during pregnancy: s/p SW consult, no barriers to discharge 4. Desires neonatal circumcision, R/B/A of procedure discussed at length. Pt understands that neonatal circumcision is not considered medically necessary and is elective. The risks include, but are not limited to bleeding, infection, damage to the penis, development of scar tissue, and having to have it redone at a later date. Pt understands theses risks and wishes to proceed 5. Dispo: stable for discharge home, discharge instructions reviewed    LOS: 2 days   Heather Hopkins 01/22/2022, 12:23 PM

## 2022-01-22 NOTE — Discharge Summary (Signed)
Postpartum Discharge Summary  Date of Service updated 01/22/22      Patient Name: Heather Hopkins DOB: August 25, 2000 MRN: [id number]  Date of admission: 01/20/2022 Delivery date:01/20/2022  Delivering provider: Allyn Kenner  Date of discharge: 01/22/2022  Admitting diagnosis: Normal labor [O80, Z37.9] Intrauterine pregnancy: [redacted]w[redacted]d    Secondary diagnosis:  Principal Problem:   Normal labor  Additional problems: none    Discharge diagnosis: Term Pregnancy Delivered                                              Post partum procedures: none Augmentation: AROM Complications: None  Hospital course: Onset of Labor With Vaginal Delivery      21y.o. yo G2P1011 at 351w0das admitted in Active Labor on 01/20/2022. Patient had an uncomplicated labor course as follows:  Membrane Rupture Time/Date: 12:34 PM ,01/20/2022   Delivery Method:Vaginal, Spontaneous  Episiotomy: None  Lacerations:  Labial  Patient had an uncomplicated postpartum course.  She is ambulating, tolerating a regular diet, passing flatus, and urinating well. Patient is discharged home in stable condition on 01/22/22.  Newborn Data: Birth date:01/20/2022  Birth time:5:25 PM  Gender:Female  Living status:Living  Apgars:9 ,9  Weight:2550 g   Magnesium Sulfate received: No BMZ received: No Rhophylac:N/A MMR:N/A T-DaP:Given prenatally Flu: No Transfusion:No  Physical exam  Vitals:   01/21/22 0910 01/21/22 1629 01/21/22 2036 01/22/22 0530  BP: 107/76 120/66 106/65 103/62  Pulse: 72 61 77 69  Resp: 18 16 16 17   Temp: 98.2 F (36.8 C) 97.9 F (36.6 C) 98.2 F (36.8 C) 98 F (36.7 C)  TempSrc: Oral Oral Oral Oral  SpO2: 100% 100% 100% 100%  Weight:      Height:       General: alert, cooperative, and no distress Lochia: appropriate Uterine Fundus: firm Incision: N/A DVT Evaluation: No evidence of DVT seen on physical exam. Labs: Lab Results  Component Value Date   WBC 14.4 (H) 01/21/2022   HGB 9.3 (L)  01/21/2022   HCT 27.4 (L) 01/21/2022   MCV 89.5 01/21/2022   PLT 258 01/21/2022      Latest Ref Rng & Units 06/30/2021    2:20 PM  CMP  Glucose 70 - 99 mg/dL 76   BUN 6 - 20 mg/dL 9   Creatinine 0.44 - 1.00 mg/dL 0.59   Sodium 135 - 145 mmol/L 135   Potassium 3.5 - 5.1 mmol/L 3.8   Chloride 98 - 111 mmol/L 105   CO2 22 - 32 mmol/L 20   Calcium 8.9 - 10.3 mg/dL 9.1   Total Protein 6.5 - 8.1 g/dL 6.8   Total Bilirubin 0.3 - 1.2 mg/dL 0.6   Alkaline Phos 38 - 126 U/L 34   AST 15 - 41 U/L 15   ALT 0 - 44 U/L 15    Edinburgh Score:     No data to display            After visit meds:  Allergies as of 01/22/2022   No Known Allergies      Medication List     STOP taking these medications    clotrimazole 1 % vaginal cream Commonly known as: GYNE-LOTRIMIN   metoCLOPramide 10 MG tablet Commonly known as: Reglan   metroNIDAZOLE 500 MG tablet Commonly known as: FLAGYL   ondansetron 8 MG disintegrating tablet  Commonly known as: ZOFRAN-ODT   pantoprazole 20 MG tablet Commonly known as: PROTONIX   prenatal multivitamin Tabs tablet   promethazine 12.5 MG tablet Commonly known as: PHENERGAN   terconazole 0.4 % vaginal cream Commonly known as: TERAZOL 7       TAKE these medications    acetaminophen 325 MG tablet Commonly known as: Tylenol Take 2 tablets (650 mg total) by mouth every 6 (six) hours as needed (for pain scale < 4). What changed: reasons to take this   ibuprofen 600 MG tablet Commonly known as: ADVIL Take 1 tablet (600 mg total) by mouth every 6 (six) hours as needed.   oxyCODONE 5 MG immediate release tablet Commonly known as: Oxy IR/ROXICODONE Take 1 tablet (5 mg total) by mouth every 4 (four) hours as needed for severe pain.         Discharge home in stable condition Infant Feeding: Breast Infant Disposition:home with mother Discharge instruction: per After Visit Summary and Postpartum booklet. Activity: Advance as tolerated.  Pelvic rest for 6 weeks.  Diet: routine diet Anticipated Birth Control: Unsure Postpartum Appointment:6 weeks Additional Postpartum F/U:  none Future Appointments:No future appointments. Follow up Visit:  [place]. Schedule an appointment as soon as possible for a visit in 6 week(s).   Contact information: [address] [place] [address] [phone]                     01/22/2022 Rowland Lathe, MD

## 2022-01-23 ENCOUNTER — Ambulatory Visit: Payer: Self-pay

## 2022-01-23 NOTE — Lactation Note (Addendum)
This note was copied from a baby's chart. Lactation Consultation Note  Patient Name: Heather Hopkins TELMR'A Date: 01/23/2022 Reason for consult: Follow-up assessment;Mother's request;Difficult latch;Early term 37-38.6wks;Infant < 6lbs;Breastfeeding assistance Age:20 hours  Birth parent provided with [facility] loaner [phone] due date for return 02/04/2022. LC delivered cash to [facility]U.   Plan 1. Infant feeding based on cues 8-12x 24hr period. Birth parent offering breasts and looking for signs of milk transfer.  2. Birth parent supplementing with EBM first followed by formula using DR. Brown preemie nipple under direction of SLP. Infant taking 30 ml per feeding.  3 post pump after each feeding for 15 mins.  Birth parent has [facility] support.   Infant increase weight of 76 grams  Maternal Data    Feeding Mother's Current Feeding Choice: Breast Milk and Formula  LATCH Score                    Lactation Tools Discussed/Used Tools: Pump;Flanges Breast pump type: Double-Electric Breast Pump Pump Education: Setup, frequency, and cleaning;Milk Storage Reason for Pumping: increase stimulation Pumping frequency: post pump after each feeding for 15 mins  Interventions Interventions: Breast feeding basics reviewed;Skin to skin;Hand express;Expressed milk;DEBP;Education;Pace feeding;Infant Driven Feeding Algorithm education;LC Services brochure;LPT handout/interventions  Discharge Discharge Education: Engorgement and breast care;Warning signs for feeding baby;Outpatient recommendation;Outpatient Epic message sent Pump: DEBP;WIC Loaner (Birth parent provided with [facility] pump number [id number]) WIC Program: Yes  Consult Status Consult Status: Complete Date: 01/23/22    Raciel Caffrey  Nicholson-Springer 01/23/2022, 5:23 PM

## 2022-02-01 ENCOUNTER — Telehealth (HOSPITAL_COMMUNITY): Payer: Self-pay

## 2022-02-01 NOTE — Telephone Encounter (Signed)
Patient reports feeling good. Patient declines questions/concerns about her health and healing.  Patient reports that baby is doing well."Eating a lot and having good diapers." Baby sleeps in a bassinet next to my bed. RN reviewed ABC's of safe sleep with patient. Patient declines any questions or concerns about baby.  EPDS score is 7.  Marcelino Duster [facility]  02/01/22,1504

## 2022-04-13 ENCOUNTER — Encounter (HOSPITAL_COMMUNITY): Payer: Self-pay

## 2022-04-13 ENCOUNTER — Ambulatory Visit (HOSPITAL_COMMUNITY)
Admission: EM | Admit: 2022-04-13 | Discharge: 2022-04-13 | Disposition: A | Discharge status: Home / Self Care | Payer: Medicaid Other | Attending: Nurse Practitioner | Admitting: Nurse Practitioner

## 2022-04-13 DIAGNOSIS — Z3200 Encounter for pregnancy test, result unknown: Secondary | ICD-10-CM

## 2022-04-13 DIAGNOSIS — Z711 Person with feared health complaint in whom no diagnosis is made: Secondary | ICD-10-CM

## 2022-04-13 DIAGNOSIS — Z202 Contact with and (suspected) exposure to infections with a predominantly sexual mode of transmission: Secondary | ICD-10-CM | POA: Diagnosis not present

## 2022-04-13 LAB — POC URINE PREG, ED: Preg Test, Ur: NEGATIVE

## 2022-04-13 NOTE — ED Provider Notes (Signed)
[facility]    CSN: [id number] Arrival date & time: 04/13/22  1558      History   Chief Complaint Chief Complaint  Patient presents with   Vaginal Discharge    Entered by patient    HPI Heather Hopkins is a 21 y.o. female.   HPI  She is in today for evaluation of STI and to have a pregnancy test. She reports that she is unsure if she has any symptoms related to STI because is his to months post delivery of her son and "nothing feels right" . She is  Past Medical History:  Diagnosis Date   Asthma     Patient Active Problem List   Diagnosis Date Noted   Normal labor 01/20/2022    Past Surgical History:  Procedure Laterality Date   NO PAST SURGERIES      OB History     Gravida  2   Para  1   Term  1   Preterm      AB  1   Living  1      SAB  1   IAB      Ectopic      Multiple  0   Live Births  1            Home Medications    Prior to Admission medications   Medication Sig Start Date End Date Taking? Authorizing Provider  acetaminophen (TYLENOL) 325 MG tablet Take 2 tablets (650 mg total) by mouth every 6 (six) hours as needed (for pain scale < 4). 01/22/22   Charlett Nose, MD  ibuprofen (ADVIL) 600 MG tablet Take 1 tablet (600 mg total) by mouth every 6 (six) hours as needed. 01/22/22   Charlett Nose, MD  oxyCODONE (OXY IR/ROXICODONE) 5 MG immediate release tablet Take 1 tablet (5 mg total) by mouth every 4 (four) hours as needed for severe pain. 01/22/22   Charlett Nose, MD    Family History Family History  Problem Relation Age of Onset   Lupus Mother    Hypertension Father     Social History Social History   Tobacco Use   Smoking status: Former    Types: Cigars   Smokeless tobacco: Never  Vaping Use   Vaping Use: Never used  Substance Use Topics   Alcohol use: Not Currently   Drug use: Not Currently    Types: Marijuana    Comment: Last smoked in may 23     Allergies   Patient has no  known allergies.   Review of Systems Review of Systems   Physical Exam Triage Vital Signs ED Triage Vitals [04/13/22 1653]  Enc Vitals Group     BP 121/78     Pulse Rate 78     Resp 16     Temp 98.1 F (36.7 C)     Temp Source Oral     SpO2 99 %     Weight      Height      Head Circumference      Peak Flow      Pain Score      Pain Loc      Pain Edu?      Excl. in GC?    No data found.  Updated Vital Signs BP 121/78 (BP Location: Left Arm)   Pulse 78   Temp 98.1 F (36.7 C) (Oral)   Resp 16   LMP  (LMP Unknown)  SpO2 99%   Breastfeeding Unknown   Visual Acuity Right Eye Distance:   Left Eye Distance:   Bilateral Distance:    Right Eye Near:   Left Eye Near:    Bilateral Near:     Physical Exam HENT:     Head: Normocephalic and atraumatic.     Nose: Nose normal.     Mouth/Throat:     Mouth: Mucous membranes are moist.  Eyes:     Pupils: Pupils are equal, round, and reactive to light.  Cardiovascular:     Rate and Rhythm: Normal rate.  Pulmonary:     Effort: Pulmonary effort is normal.  Genitourinary:    Comments: Deferred self swab Musculoskeletal:        General: Normal range of motion.  Skin:    General: Skin is warm.     Capillary Refill: Capillary refill takes less than 2 seconds.  Neurological:     General: No focal deficit present.     Mental Status: She is alert and oriented to person, place, and time.  Psychiatric:        Mood and Affect: Mood normal.        Behavior: Behavior normal.      UC Treatments / Results  Labs (all labs ordered are listed, but only abnormal results are displayed) Labs Reviewed  POC URINE PREG, ED  CERVICOVAGINAL ANCILLARY ONLY    EKG   Radiology No results found.  Procedures Procedures (including critical care time)  Medications Ordered in UC Medications - No data to display  Initial Impression / Assessment and Plan / UC Course  I have reviewed the triage vital signs and the nursing  notes.  Pertinent labs & imaging results that were available during my care of the patient were reviewed by me and considered in my medical decision making (see chart for details).     Final Clinical Impressions(s) / UC Diagnoses   Final diagnoses:  Concern about STD in female without diagnosis     Discharge Instructions      Your NuSwab is pending  Your urine pregnancy test is pending.  Your results will come via myChart     ED Prescriptions   None    PDMP not reviewed this encounter.   Barbette Merino, NP 04/13/22 1758

## 2022-04-13 NOTE — Discharge Instructions (Addendum)
Your NuSwab is pending  Your urine pregnancy test is pending.  Your results will come via myChart

## 2022-04-13 NOTE — ED Triage Notes (Signed)
Pt would like a pregnancy test.

## 2022-04-13 NOTE — ED Triage Notes (Signed)
Pt presents to the office for STI testing. Denies any symptoms at this time.

## 2022-04-14 LAB — CERVICOVAGINAL ANCILLARY ONLY
Bacterial Vaginitis (gardnerella): NEGATIVE
Candida Glabrata: NEGATIVE
Candida Vaginitis: POSITIVE — AB
Chlamydia: NEGATIVE
Comment: NEGATIVE
Comment: NEGATIVE
Comment: NEGATIVE
Comment: NEGATIVE
Comment: NEGATIVE
Comment: NORMAL
Neisseria Gonorrhea: NEGATIVE
Trichomonas: NEGATIVE

## 2022-04-15 ENCOUNTER — Telehealth (HOSPITAL_COMMUNITY): Payer: Self-pay

## 2022-04-15 MED ORDER — FLUCONAZOLE 150 MG PO TABS: ORAL_TABLET | ORAL | 0 refills | Status: DC

## 2022-04-15 NOTE — Telephone Encounter (Signed)
Pt pos for yeast. Sending Diflucan to pharmacy of choice.

## 2022-05-02 IMAGING — CT CT ABD-PELV W/ CM
2 of 4 series · 16 of 46 positions shown, 18 images · IV contrast (omnipaque)
Comparison: None.

CLINICAL DATA: Right lower quadrant pain, appendicitis suspected

EXAM:
CT ABDOMEN AND PELVIS WITH CONTRAST
TECHNIQUE: Multidetector CT imaging of the abdomen and pelvis was performed
using the standard protocol following bolus administration of
intravenous contrast.
CONTRAST:  80mL OMNIPAQUE IOHEXOL 300 MG/ML  SOLN

[Series 2: axial st · axial · 0.72mm/px · z∈[+824,+1179]mm · 13 of 81 slices shown, 15 images]
[im 5/81  soft-tissue]
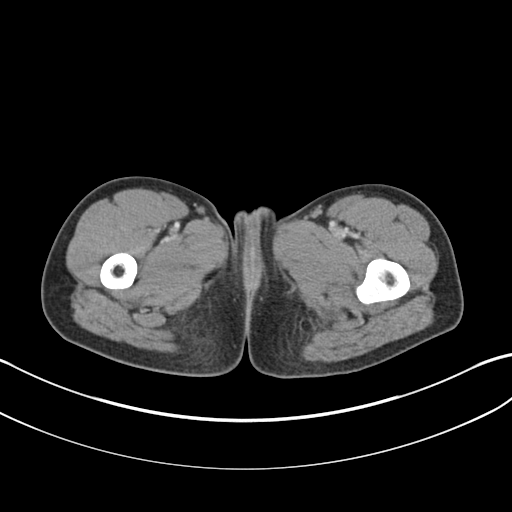
[im 5/81  bone]
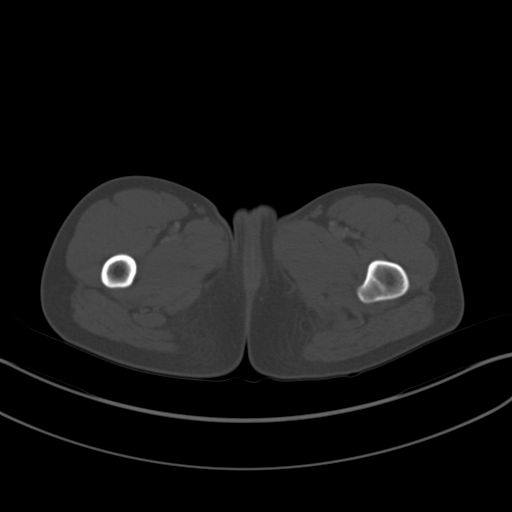
[im 9/81  soft-tissue]
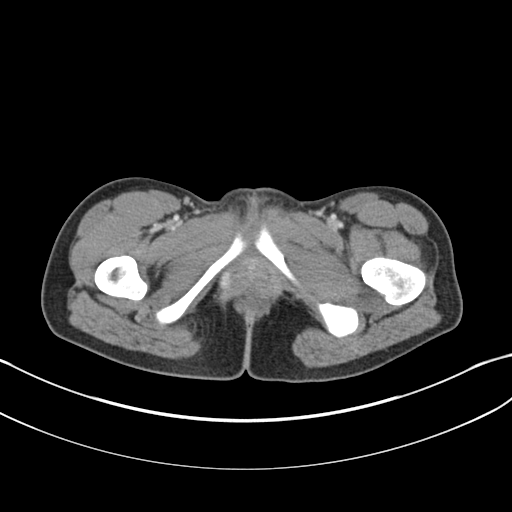
[im 18/81  soft-tissue]
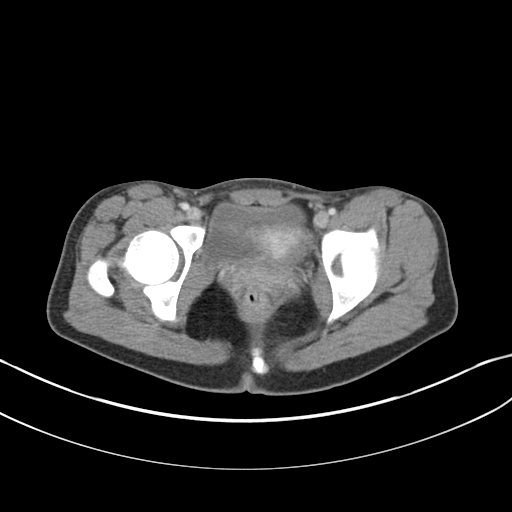
[im 23/81  soft-tissue]
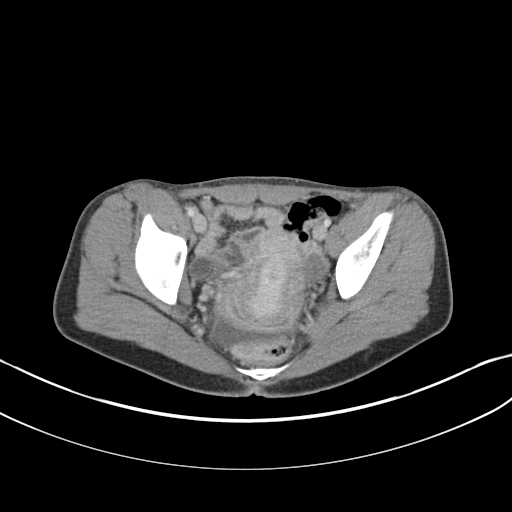
[im 27/81  soft-tissue]
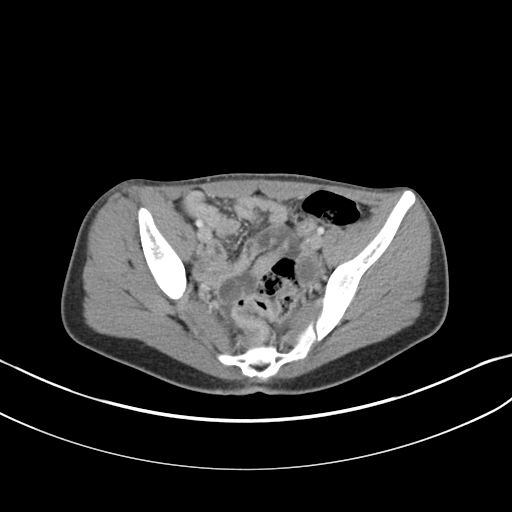
[im 36/81  soft-tissue]
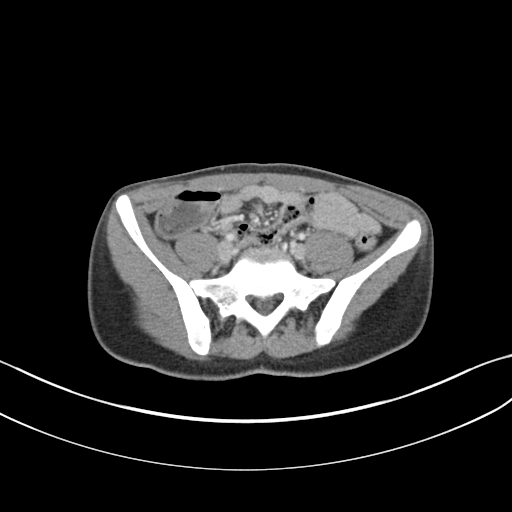
[im 41/81  soft-tissue]
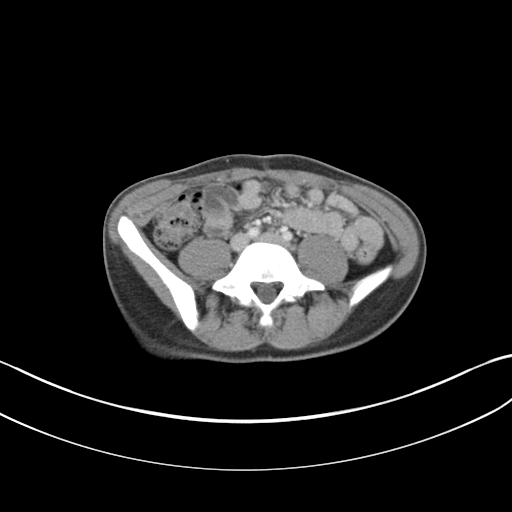
[im 45/81  soft-tissue]
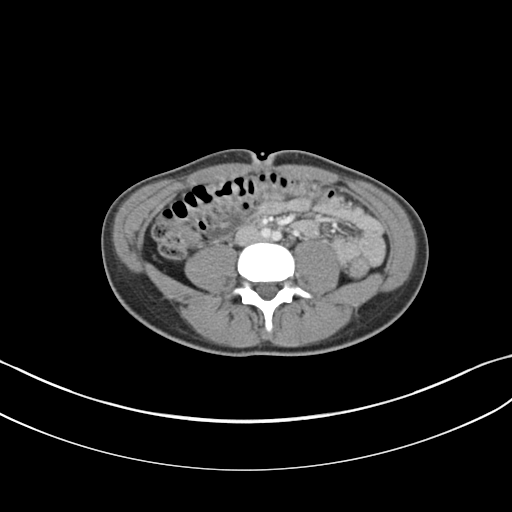
[im 54/81  soft-tissue]
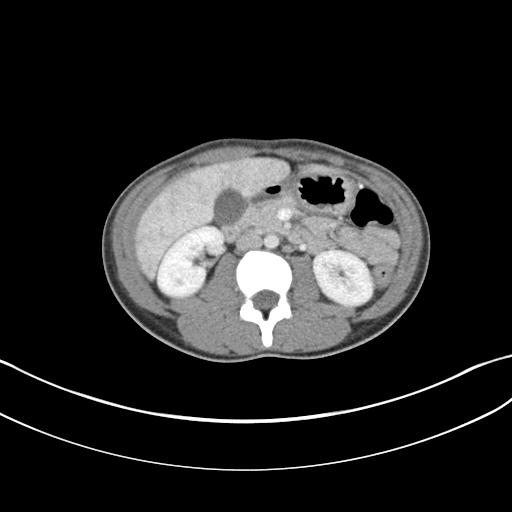
[im 54/81  bone]
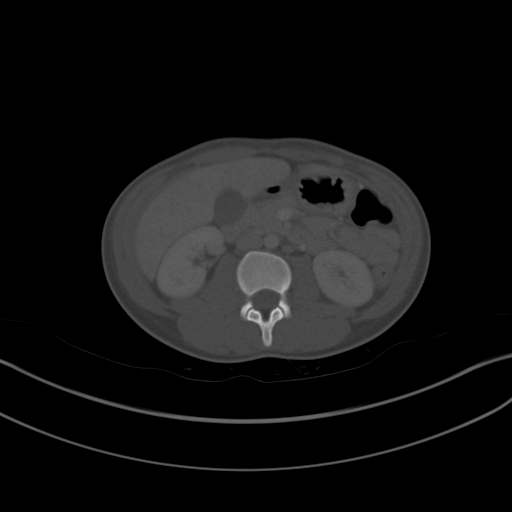
[im 58/81  soft-tissue]
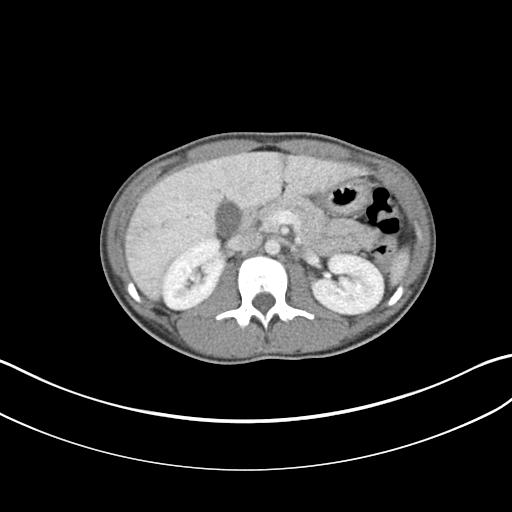
[im 63/81  soft-tissue]
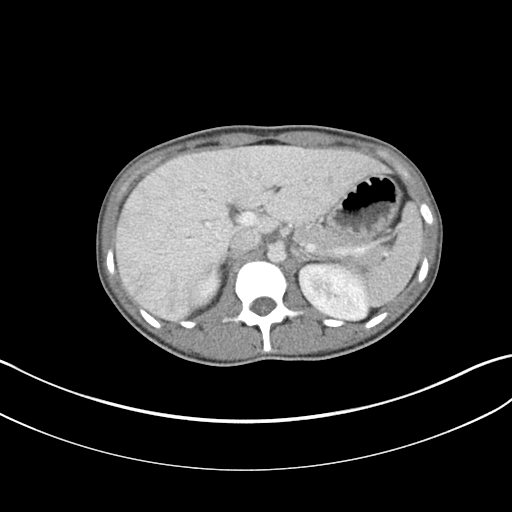
[im 72/81  soft-tissue]
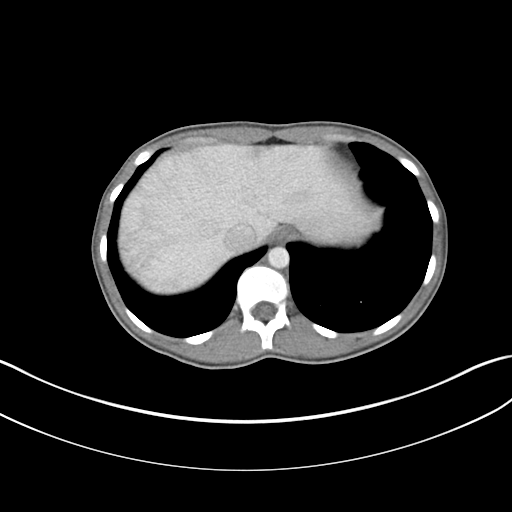
[im 76/81  soft-tissue]
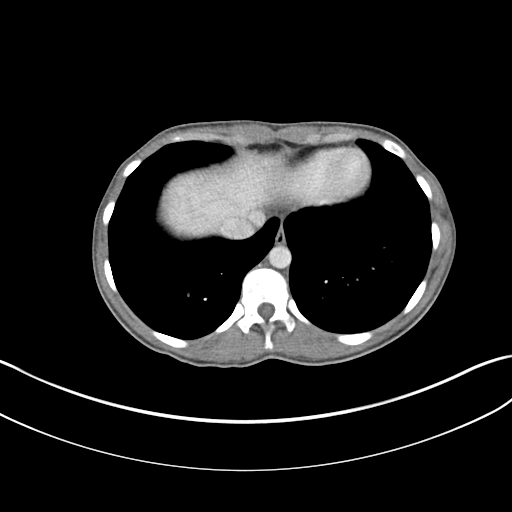

[Series 5: coronal st · coronal · 0.65mm/px · 3 of 128 slices shown]
[im 43/128  soft-tissue]
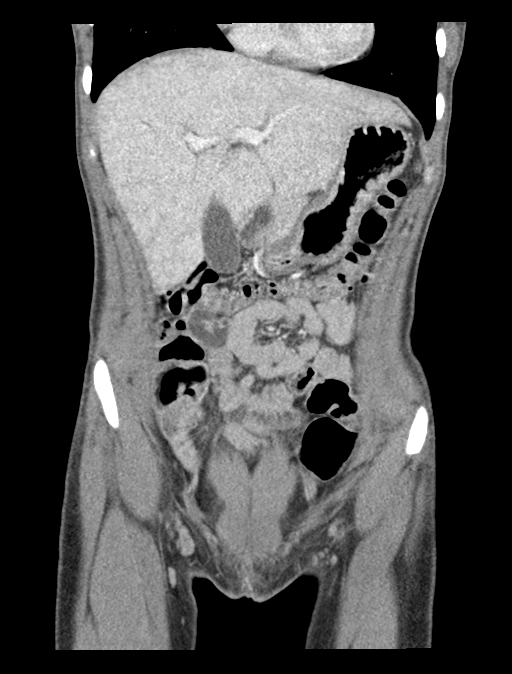
[im 57/128  soft-tissue]
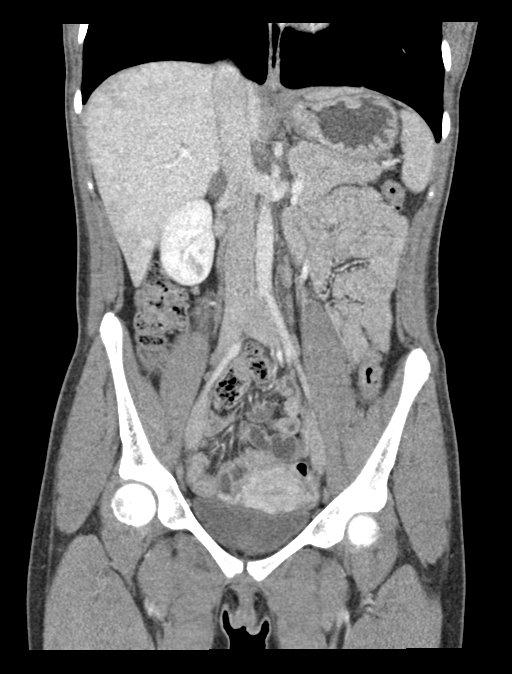
[im 71/128  soft-tissue]
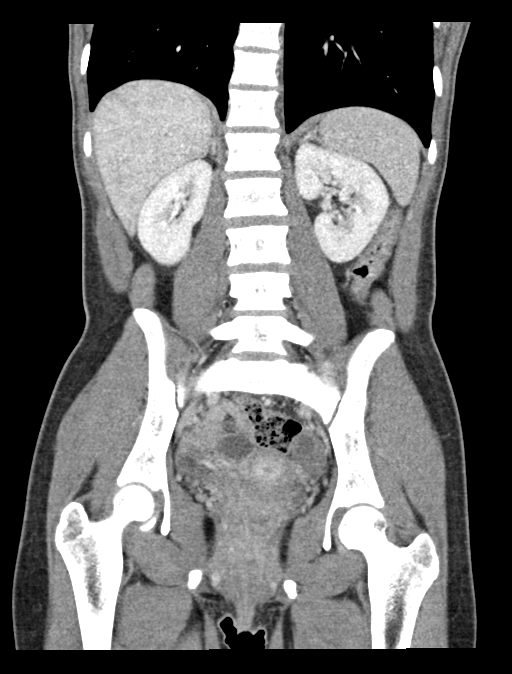

[16 of 46 positions shown; findings below may reference images not displayed]

FINDINGS: Lower chest: No acute abnormality.

Hepatobiliary: No solid liver abnormality is seen. Mild periportal
edema. No gallstones, gallbladder wall thickening, or biliary
dilatation.

Pancreas: Unremarkable. No pancreatic ductal dilatation or
surrounding inflammatory changes.

Spleen: Normal in size without significant abnormality.

Adrenals/Urinary Tract: Adrenal glands are unremarkable. Kidneys are
normal, without renal calculi, solid lesion, or hydronephrosis.
Bladder is unremarkable.

Stomach/Bowel: Stomach is within normal limits. Appendix is
partially visualized and normal in appearance (series 2, image 51).
No evidence of bowel wall thickening, distention, or inflammatory
changes.

Vascular/Lymphatic: No significant vascular findings are present. No
enlarged abdominal or pelvic lymph nodes.

Reproductive: No mass or other significant abnormality. Bilateral
ovarian cysts and follicles.

Other: No abdominal wall hernia or abnormality. Small volume free
fluid in the low pelvis.

Musculoskeletal: No acute or significant osseous findings.
IMPRESSION: 1. Appendix is partially visualized and normal in appearance.
2. Mild periportal edema, nonspecific but can be seen in acute
hepatitis. Correlate with LFTs.
3. Small volume free fluid in the low pelvis, likely functional in
the reproductive age setting.

## 2022-05-03 IMAGING — US US ABDOMEN LIMITED
1 series · 14 of 25 positions shown · non-contrast
Comparison: CT 02/28/2020.

CLINICAL DATA: Right upper quadrant abdominal pain.

EXAM:
ULTRASOUND ABDOMEN LIMITED RIGHT UPPER QUADRANT

[Series 1: us abdomen limited · 39 acquisitions, 14 frames shown]
[im 1/39]
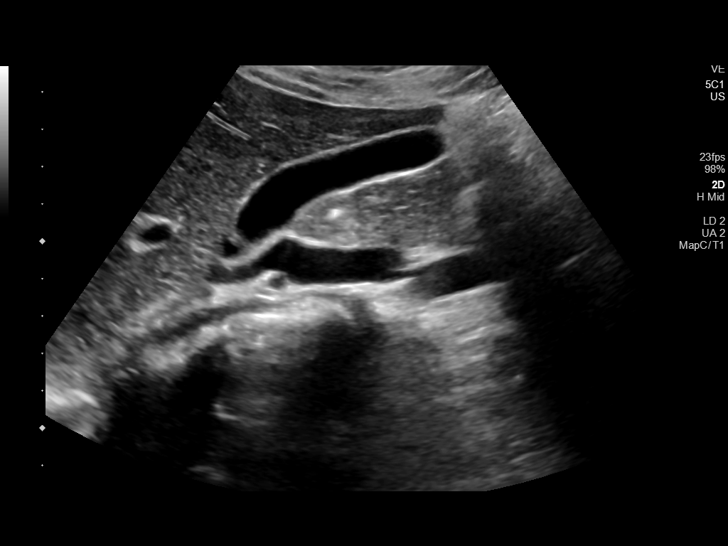
[im 4/39]
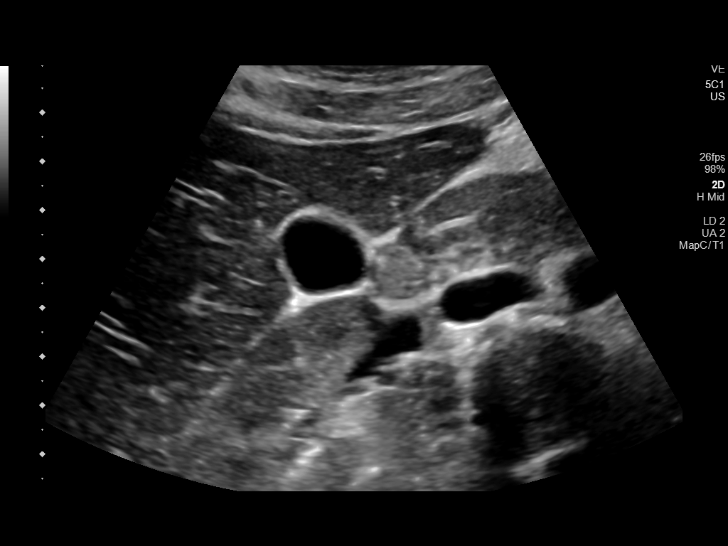
[im 7/39]
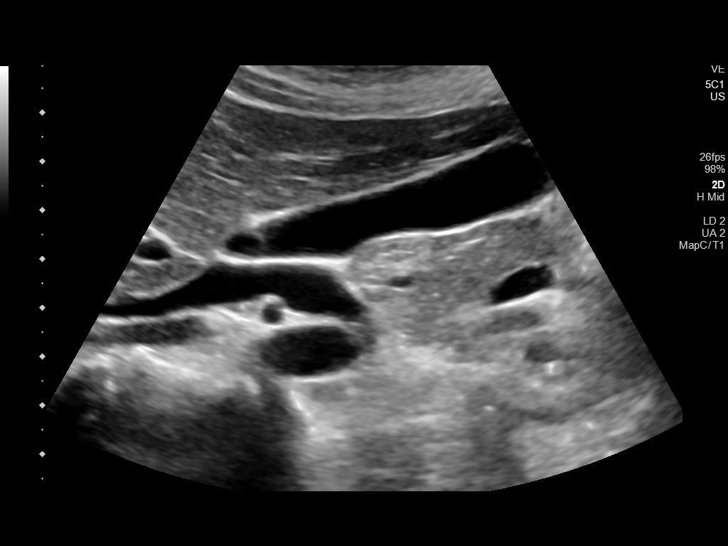
[im 10/39]
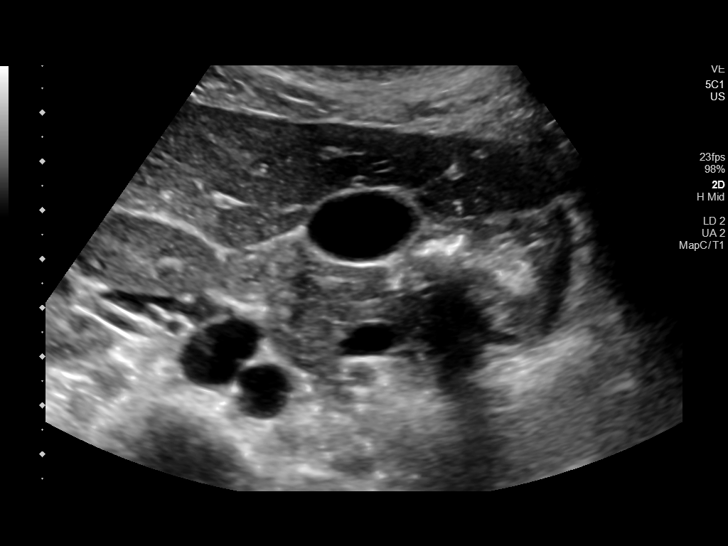
[im 13/39]
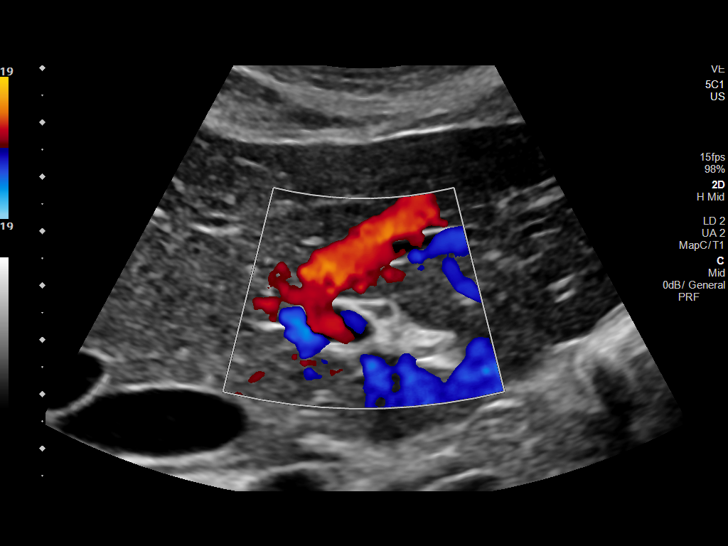
[im 15/39]
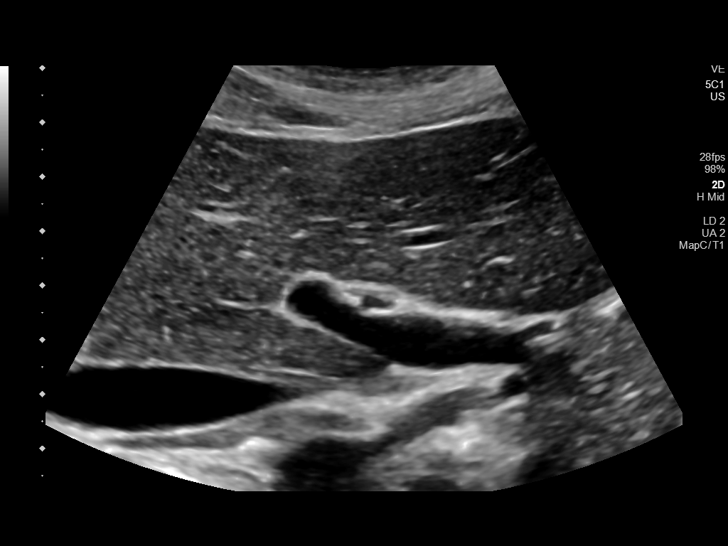
[im 18/39]
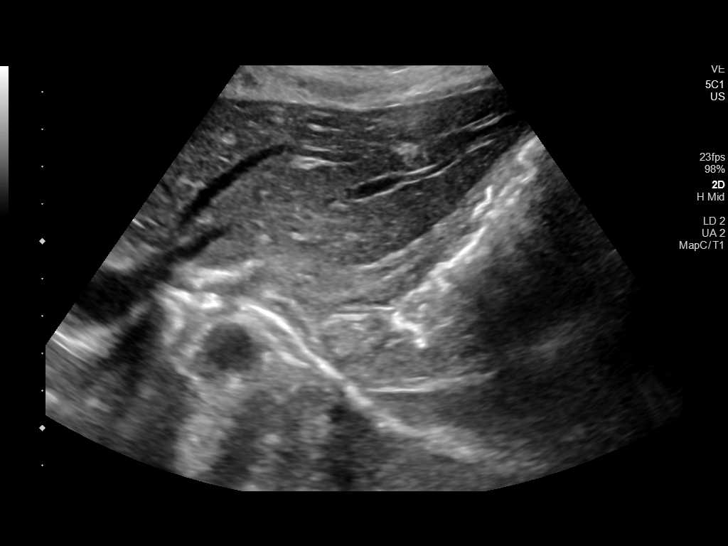
[im 21/39]
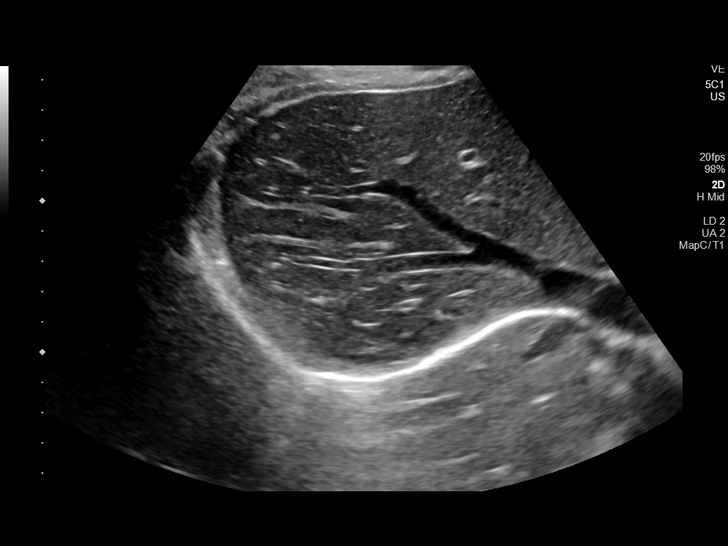
[im 24/39]
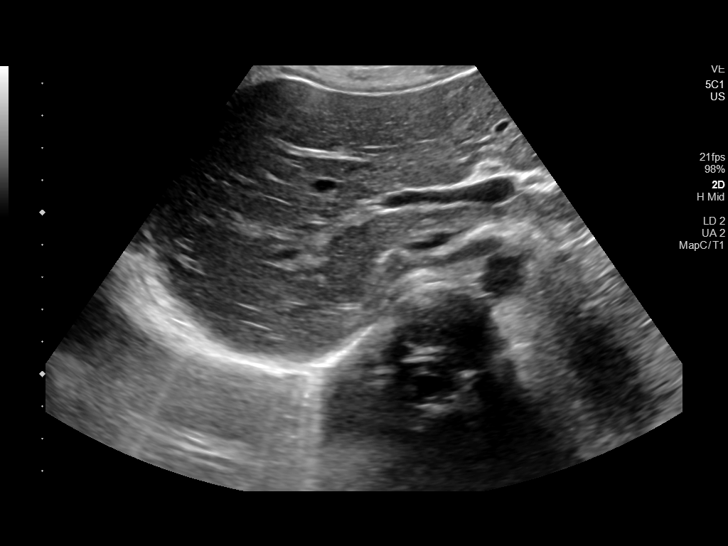
[im 26/39]
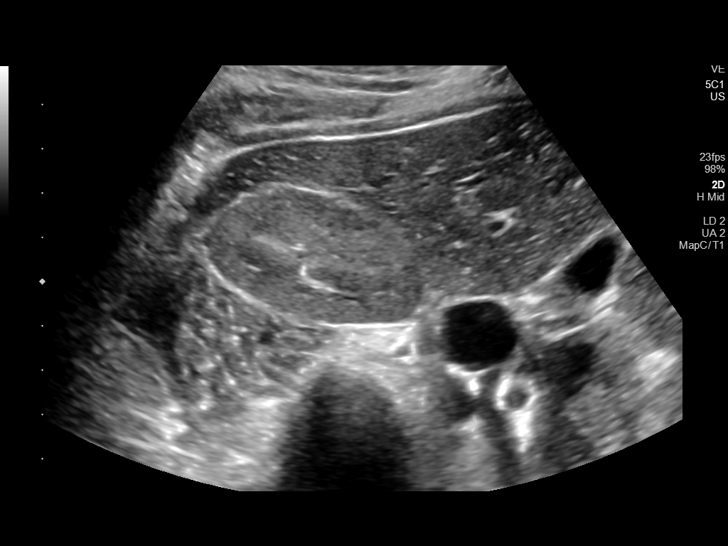
[im 29/39]
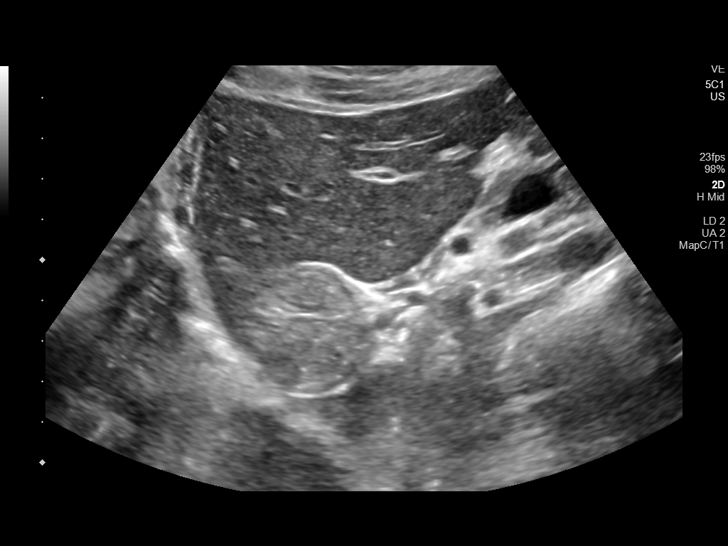
[im 32/39]
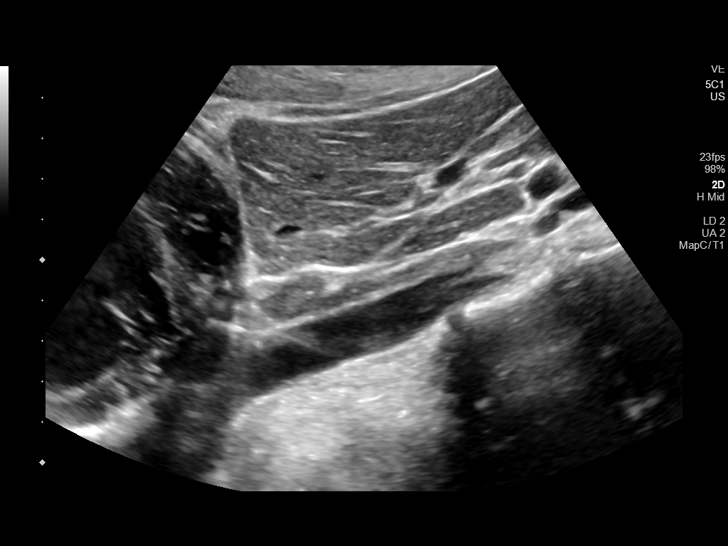
[im 35/39]
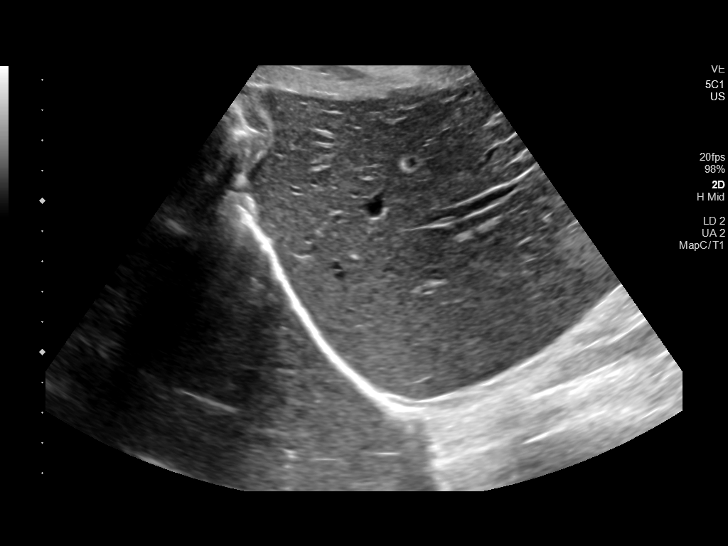
[im 39/39]
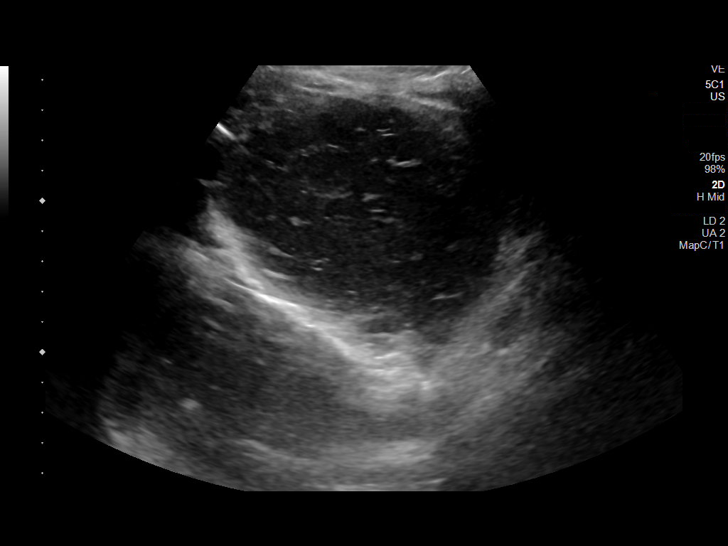

[14 of 25 positions shown; findings below may reference images not displayed]

FINDINGS: Gallbladder:

No gallstones or wall thickening visualized. No sonographic Murphy
sign noted by sonographer.

Common bile duct:

Diameter: 4 mm

Liver:

No focal lesion identified. Within normal limits in parenchymal
echogenicity. Portal vein is patent on color Doppler imaging with
normal direction of blood flow towards the liver.

Other: None.
IMPRESSION: Normal right upper quadrant abdominal ultrasound.

## 2023-01-30 LAB — OB RESULTS CONSOLE RUBELLA ANTIBODY, IGM: Rubella: IMMUNE

## 2023-01-30 LAB — OB RESULTS CONSOLE HEPATITIS B SURFACE ANTIGEN: Hepatitis B Surface Ag: NEGATIVE

## 2023-01-30 LAB — OB RESULTS CONSOLE RPR: RPR: NONREACTIVE

## 2023-05-24 NOTE — L&D Delivery Note (Signed)
 Delivery Note At 10:15 AM a viable and healthy female was delivered via Vaginal, Spontaneous (Presentation: Left Occiput Anterior    ).  APGAR: 9, 10; weight pending.   Placenta status: Spontaneous, intact.  Cord: Three-vessel cord  Patient pushed with 1 contraction and delivered a vigorous female infant in the vertex LOA presentation with Apgar scores of 9 at 1 minute and 10 at 5 minutes.  Following delivery, the infant was passed to the waiting maternal abdomen.  After a 1 minute delay the cord was clamped and cut.  The placenta delivered spontaneously, intact, with 3 vessels.  No lacerations required repair.  Mom and baby are doing well following delivery.  EBL 224 cc.  All sponge, needle, instrument counts were correct.  Patient desires circumcision  Anesthesia: Epidural Episiotomy: None Lacerations: None Suture Repair: Not applicable Est. Blood Loss (mL): 224 cc  Mom to postpartum.  Baby to Couplet care / Skin to Skin.  Heather Hopkins 08/30/2023, 10:30 AM

## 2023-06-15 LAB — OB RESULTS CONSOLE HIV ANTIBODY (ROUTINE TESTING): HIV: NONREACTIVE

## 2023-08-08 LAB — OB RESULTS CONSOLE GC/CHLAMYDIA
Chlamydia: NEGATIVE
Neisseria Gonorrhea: NEGATIVE

## 2023-08-08 LAB — OB RESULTS CONSOLE GBS: GBS: NEGATIVE

## 2023-08-30 ENCOUNTER — Inpatient Hospital Stay (HOSPITAL_COMMUNITY)
Admission: AD | Admit: 2023-08-30 | Discharge: 2023-08-31 | DRG: 807 | Disposition: A | Discharge status: Home / Self Care | Attending: Obstetrics and Gynecology | Admitting: Obstetrics and Gynecology

## 2023-08-30 ENCOUNTER — Other Ambulatory Visit: Payer: Self-pay

## 2023-08-30 ENCOUNTER — Inpatient Hospital Stay (HOSPITAL_COMMUNITY): Admitting: Anesthesiology

## 2023-08-30 ENCOUNTER — Encounter (HOSPITAL_COMMUNITY): Payer: Self-pay | Admitting: Obstetrics and Gynecology

## 2023-08-30 DIAGNOSIS — Z87891 Personal history of nicotine dependence: Secondary | ICD-10-CM | POA: Diagnosis not present

## 2023-08-30 DIAGNOSIS — Z3A39 39 weeks gestation of pregnancy: Secondary | ICD-10-CM | POA: Diagnosis not present

## 2023-08-30 DIAGNOSIS — Z8249 Family history of ischemic heart disease and other diseases of the circulatory system: Secondary | ICD-10-CM

## 2023-08-30 DIAGNOSIS — O26893 Other specified pregnancy related conditions, third trimester: Secondary | ICD-10-CM | POA: Diagnosis present

## 2023-08-30 LAB — CBC
HCT: 32.8 % — ABNORMAL LOW (ref 36.0–46.0)
Hemoglobin: 11 g/dL — ABNORMAL LOW (ref 12.0–15.0)
MCH: 30.2 pg (ref 26.0–34.0)
MCHC: 33.5 g/dL (ref 30.0–36.0)
MCV: 90.1 fL (ref 80.0–100.0)
Platelets: 287 10*3/uL (ref 150–400)
RBC: 3.64 MIL/uL — ABNORMAL LOW (ref 3.87–5.11)
RDW: 14.1 % (ref 11.5–15.5)
WBC: 14.9 10*3/uL — ABNORMAL HIGH (ref 4.0–10.5)
nRBC: 0 % (ref 0.0–0.2)

## 2023-08-30 LAB — RPR: RPR Ser Ql: NONREACTIVE

## 2023-08-30 LAB — TYPE AND SCREEN
ABO/RH(D): O POS
Antibody Screen: NEGATIVE

## 2023-08-30 MED ORDER — ONDANSETRON HCL 4 MG/2ML IJ SOLN: 4.0000 mg | Freq: Four times a day (QID) | INTRAMUSCULAR | Status: DC | PRN

## 2023-08-30 MED ORDER — PRENATAL MULTIVITAMIN CH
1.0000 | ORAL_TABLET | Freq: Every day | ORAL | Status: DC
  Administered 2023-08-30 – 2023-08-31 (×2): 1 via ORAL
  Filled 2023-08-30 (×2): qty 1

## 2023-08-30 MED ORDER — OXYCODONE HCL 5 MG PO TABS
5.0000 mg | ORAL_TABLET | ORAL | Status: DC | PRN
  Administered 2023-08-30 – 2023-08-31 (×2): 5 mg via ORAL
  Filled 2023-08-30 (×2): qty 1

## 2023-08-30 MED ORDER — METHYLERGONOVINE MALEATE 0.2 MG/ML IJ SOLN: 0.2000 mg | INTRAMUSCULAR | Status: DC | PRN

## 2023-08-30 MED ORDER — EPHEDRINE 5 MG/ML INJ: 10.0000 mg | INTRAVENOUS | Status: DC | PRN

## 2023-08-30 MED ORDER — OXYCODONE HCL 5 MG PO TABS
10.0000 mg | ORAL_TABLET | ORAL | Status: DC | PRN
  Administered 2023-08-31 (×2): 10 mg via ORAL
  Filled 2023-08-30 (×2): qty 2

## 2023-08-30 MED ORDER — SENNOSIDES-DOCUSATE SODIUM 8.6-50 MG PO TABS
2.0000 | ORAL_TABLET | Freq: Every day | ORAL | Status: DC
  Administered 2023-08-31: 2 via ORAL
  Filled 2023-08-30: qty 2

## 2023-08-30 MED ORDER — ZOLPIDEM TARTRATE 5 MG PO TABS: 5.0000 mg | ORAL_TABLET | Freq: Every evening | ORAL | Status: DC | PRN

## 2023-08-30 MED ORDER — ONDANSETRON HCL 4 MG/2ML IJ SOLN: 4.0000 mg | INTRAMUSCULAR | Status: DC | PRN

## 2023-08-30 MED ORDER — WITCH HAZEL-GLYCERIN EX PADS: 1.0000 | MEDICATED_PAD | CUTANEOUS | Status: DC | PRN

## 2023-08-30 MED ORDER — LIDOCAINE HCL (PF) 1 % IJ SOLN
INTRAMUSCULAR | Status: DC | PRN
  Administered 2023-08-30: 10 mL via EPIDURAL

## 2023-08-30 MED ORDER — METHYLERGONOVINE MALEATE 0.2 MG PO TABS: 0.2000 mg | ORAL_TABLET | ORAL | Status: DC | PRN

## 2023-08-30 MED ORDER — FENTANYL-BUPIVACAINE-NACL 0.5-0.125-0.9 MG/250ML-% EP SOLN
12.0000 mL/h | EPIDURAL | Status: DC | PRN
  Administered 2023-08-30: 12 mL/h via EPIDURAL
  Filled 2023-08-30: qty 250

## 2023-08-30 MED ORDER — LACTATED RINGERS IV SOLN: INTRAVENOUS | Status: DC

## 2023-08-30 MED ORDER — DIPHENHYDRAMINE HCL 25 MG PO CAPS: 25.0000 mg | ORAL_CAPSULE | Freq: Four times a day (QID) | ORAL | Status: DC | PRN

## 2023-08-30 MED ORDER — OXYTOCIN-SODIUM CHLORIDE 30-0.9 UT/500ML-% IV SOLN: 2.5000 [IU]/h | INTRAVENOUS | Status: DC | PRN

## 2023-08-30 MED ORDER — LACTATED RINGERS IV SOLN: 500.0000 mL | INTRAVENOUS | Status: DC | PRN

## 2023-08-30 MED ORDER — ACETAMINOPHEN 325 MG PO TABS
650.0000 mg | ORAL_TABLET | ORAL | Status: DC | PRN
  Administered 2023-08-30 – 2023-08-31 (×3): 650 mg via ORAL
  Filled 2023-08-30 (×3): qty 2

## 2023-08-30 MED ORDER — LIDOCAINE HCL (PF) 1 % IJ SOLN: 30.0000 mL | INTRAMUSCULAR | Status: DC | PRN

## 2023-08-30 MED ORDER — BENZOCAINE-MENTHOL 20-0.5 % EX AERO: 1.0000 | INHALATION_SPRAY | CUTANEOUS | Status: DC | PRN

## 2023-08-30 MED ORDER — SIMETHICONE 80 MG PO CHEW: 80.0000 mg | CHEWABLE_TABLET | ORAL | Status: DC | PRN

## 2023-08-30 MED ORDER — PHENYLEPHRINE 80 MCG/ML (10ML) SYRINGE FOR IV PUSH (FOR BLOOD PRESSURE SUPPORT): 80.0000 ug | PREFILLED_SYRINGE | INTRAVENOUS | Status: DC | PRN

## 2023-08-30 MED ORDER — DIBUCAINE (PERIANAL) 1 % EX OINT: 1.0000 | TOPICAL_OINTMENT | CUTANEOUS | Status: DC | PRN

## 2023-08-30 MED ORDER — IBUPROFEN 600 MG PO TABS
600.0000 mg | ORAL_TABLET | Freq: Four times a day (QID) | ORAL | Status: DC
  Administered 2023-08-30 – 2023-08-31 (×5): 600 mg via ORAL
  Filled 2023-08-30 (×6): qty 1

## 2023-08-30 MED ORDER — TETANUS-DIPHTH-ACELL PERTUSSIS 5-2.5-18.5 LF-MCG/0.5 IM SUSY: 0.5000 mL | PREFILLED_SYRINGE | Freq: Once | INTRAMUSCULAR | Status: DC

## 2023-08-30 MED ORDER — ONDANSETRON HCL 4 MG PO TABS
4.0000 mg | ORAL_TABLET | ORAL | Status: DC | PRN
  Administered 2023-08-30: 4 mg via ORAL
  Filled 2023-08-30: qty 1

## 2023-08-30 MED ORDER — DIPHENHYDRAMINE HCL 50 MG/ML IJ SOLN: 12.5000 mg | INTRAMUSCULAR | Status: DC | PRN

## 2023-08-30 MED ORDER — FLEET ENEMA RE ENEM: 1.0000 | ENEMA | RECTAL | Status: DC | PRN

## 2023-08-30 MED ORDER — OXYTOCIN-SODIUM CHLORIDE 30-0.9 UT/500ML-% IV SOLN
2.5000 [IU]/h | INTRAVENOUS | Status: DC
  Administered 2023-08-30: 2.5 [IU]/h via INTRAVENOUS
  Filled 2023-08-30: qty 500

## 2023-08-30 MED ORDER — OXYCODONE-ACETAMINOPHEN 5-325 MG PO TABS: 1.0000 | ORAL_TABLET | ORAL | Status: DC | PRN

## 2023-08-30 MED ORDER — ACETAMINOPHEN 325 MG PO TABS: 650.0000 mg | ORAL_TABLET | ORAL | Status: DC | PRN

## 2023-08-30 MED ORDER — OXYTOCIN BOLUS FROM INFUSION
333.0000 mL | Freq: Once | INTRAVENOUS | Status: AC
  Administered 2023-08-30: 333 mL via INTRAVENOUS

## 2023-08-30 MED ORDER — COCONUT OIL OIL: 1.0000 | TOPICAL_OIL | Status: DC | PRN

## 2023-08-30 MED ORDER — LACTATED RINGERS IV SOLN
500.0000 mL | Freq: Once | INTRAVENOUS | Status: AC
  Administered 2023-08-30: 500 mL via INTRAVENOUS

## 2023-08-30 MED ORDER — OXYCODONE-ACETAMINOPHEN 5-325 MG PO TABS: 2.0000 | ORAL_TABLET | ORAL | Status: DC | PRN

## 2023-08-30 MED ORDER — SOD CITRATE-CITRIC ACID 500-334 MG/5ML PO SOLN: 30.0000 mL | ORAL | Status: DC | PRN

## 2023-08-30 NOTE — Lactation Note (Signed)
 This note was copied from a baby's chart. Lactation Consultation Note  Patient Name: Heather Hopkins JOACZ'Y Date: 08/30/2023 Age:22 hours Reason for consult: Initial assessment;Term;Exclusive pumping and bottle feeding Per [name], infant has not feed in 8 hours since birth. [name] informed LC she did not want to latch infant at the breast prefers to " pump only and formula feed infant. LC gave handout with feeding guidelines [name] wanted assistance with feeding infant, infant did not do well with yellow slow flow bottle nipple. LC changed nipple to [facility] nipple, infant struggled using this nipple, gagging, arching and tongue thrusting, infant received 5 mls from bottle and spoon feeding. LC discussed infant may need a SLP consult if he does not improve in his feedings, LC informed RN. [name] will continue to feed infant every 8+ times within 24 hours, pace feeding infant, lots of burping. [name] will offer any EBM first and then formula. LC set [name] up with DEBP using a 24 mm breast flange, LC observed [name] had expressed 4 mls of colostrum and was still expressing as LC left the room. [name] will offer infant any EBM first before formula. [name] knows that her EBM is safe for 4 hours at room temperature whereas formula once opened must be used within 1 hour.   Maternal Data Has patient been taught Hand Expression?: No Does the patient have breastfeeding experience prior to this delivery?: Yes How long did the patient breastfeed?: Per [name], she felt forced to breastfeed her 1st child had bad breastfeeding  experience with LC services,they keep coming in her room, they forced her to breastfeed when she did not want to, she pumped and BF for 3 months. [name] is open to pumping only and formula feeding infant.  Feeding Mother's Current Feeding Choice: Breast Milk and Formula Nipple Type: Nfant Standard Flow (white)  LATCH Score  [name] is " pumping only and formula infant".                   Lactation Tools  Discussed/Used Tools: Flanges;Pump Flange Size: 24 Breast pump type: Double-Electric Breast Pump Pump Education: Setup, frequency, and cleaning;Milk Storage Reason for Pumping: [name]'s choice is to pump only and formula feed infant. Pumping frequency: [name] will continue to use DEBP evrey 3 hours for 15 minutes on inital setting. Pumped volume: 4 mL (She was still expressing colostrum in flanges as LC left the room.)  Interventions Interventions: Breast feeding basics reviewed;Expressed milk;Education;Pace feeding;Guidelines for Milk Supply and Pumping Schedule Handout;LC Services brochure;CDC milk storage guidelines;CDC Guidelines for Breast Pump Cleaning  Discharge Pump: DEBP;Personal  Consult Status Consult Status: Follow-up Date: 08/31/23 Follow-up type: In-patient    Frederico Hamman 08/30/2023, 7:29 PM

## 2023-08-30 NOTE — H&P (Signed)
 Heather Hopkins is a 23 y.o. female presenting for evaluation of contractions. Was 4cm in office earlier today, declined membrane sweep. +FM, denies VB. LOF. Contractions worsenin around 0200  PNC c/b 1) RPR reactive at 28wks - neg Tr Ab testing, only 1:1 titer 2) h/o IUGR in G1 - GS at 34 wks EFW 2110/4lb10oz, EFW 27th%tile, AFI WNL    *bilateral polydactyly - pt and FOB have this as well  GBS neg. OB History     Gravida  3   Para  1   Term  1   Preterm      AB  1   Living  1      SAB  1   IAB      Ectopic      Multiple  0   Live Births  1          Past Medical History:  Diagnosis Date   Asthma    Past Surgical History:  Procedure Laterality Date   NO PAST SURGERIES     Family History: family history includes Hypertension in her father; Lupus in her mother. Social History:  reports that she has quit smoking. Her smoking use included cigars. She has never used smokeless tobacco. She reports that she does not currently use alcohol. She reports that she does not currently use drugs after having used the following drugs: Marijuana.     Maternal Diabetes: No1hr 95 Genetic Screening: Normal Maternal Ultrasounds/Referrals: Normal Fetal Ultrasounds or other Referrals:  None Maternal Substance Abuse:  No Significant Maternal Medications:  None Significant Maternal Lab Results:  Group B Strep negative Number of Prenatal Visits:greater than 3 verified prenatal visits Maternal Vaccinations:none Other Comments:  None  Review of Systems  Constitutional:  Negative for chills and fever.  Respiratory:  Negative for shortness of breath.   Cardiovascular:  Negative for chest pain, palpitations and leg swelling.  Gastrointestinal:  Negative for abdominal pain, nausea and vomiting.  Neurological:  Negative for dizziness, weakness and headaches.  Psychiatric/Behavioral:  Negative for suicidal ideas.    Maternal Medical History:  Reason for admission:  Nausea.    Dilation: 6.5 Effacement (%): 80 Station: -1 Exam by:: Heather Baton, RN Blood pressure 128/71, pulse 89, temperature 97.6 F (36.4 C), temperature source Oral, resp. rate 18, height 5\' 1"  (1.549 m), weight 56 kg, unknown if currently breastfeeding. Exam Physical Exam  Prenatal labs: ABO, Rh: --/--/PENDING (04/09 0446)Opos Antibody: PENDING (04/09 0446)neg Rubella:  imm RPR:   pending HBsAg:   neg HIV:   nr GBS:   neg  Cat 1 tracing, mod va,r + accels, + early decels TOCO q4-49m    Assessment/Plan: This is a 22yo G3P1011 @ 39 2/7 by 9wk TUVS NOT c/w LMP admitted in labor. Desires epidural. GBS neg. Pelvis proven to5;b10oz. EFW at 34wks 4lb10oz. Anticipate SVD   Heather Hopkins Heather Hopkins 08/30/2023, 5:01 AM

## 2023-08-30 NOTE — Anesthesia Preprocedure Evaluation (Signed)
 Anesthesia Evaluation  Patient identified by MRN, date of birth, ID band Patient awake    Reviewed: Allergy & Precautions, NPO status , Patient's Chart, lab work & pertinent test results  Airway Mallampati: II  TM Distance: >3 FB Neck ROM: Full    Dental no notable dental hx.    Pulmonary asthma , former smoker   Pulmonary exam normal breath sounds clear to auscultation       Cardiovascular negative cardio ROS Normal cardiovascular exam Rhythm:Regular Rate:Normal     Neuro/Psych negative neurological ROS  negative psych ROS   GI/Hepatic negative GI ROS, Neg liver ROS,,,  Endo/Other  negative endocrine ROS    Renal/GU negative Renal ROS  negative genitourinary   Musculoskeletal negative musculoskeletal ROS (+)    Abdominal   Peds  Hematology negative hematology ROS (+)   Anesthesia Other Findings Presents in labor  Reproductive/Obstetrics (+) Pregnancy                             Anesthesia Physical Anesthesia Plan  ASA: 2  Anesthesia Plan: Epidural   Post-op Pain Management:    Induction:   PONV Risk Score and Plan: Treatment may vary due to age or medical condition  Airway Management Planned: Natural Airway  Additional Equipment:   Intra-op Plan:   Post-operative Plan:   Informed Consent: I have reviewed the patients History and Physical, chart, labs and discussed the procedure including the risks, benefits and alternatives for the proposed anesthesia with the patient or authorized representative who has indicated his/her understanding and acceptance.       Plan Discussed with: Anesthesiologist  Anesthesia Plan Comments: (Patient identified. Risks, benefits, options discussed with patient including but not limited to bleeding, infection, nerve damage, paralysis, failed block, incomplete pain control, headache, blood pressure changes, nausea, vomiting, reactions to  medication, itching, and post partum back pain. Confirmed with bedside nurse the patient's most recent platelet count. Confirmed with the patient that they are not taking any anticoagulation, have any bleeding history or any family history of bleeding disorders. Patient expressed understanding and wishes to proceed. All questions were answered. )       Anesthesia Quick Evaluation

## 2023-08-30 NOTE — Anesthesia Procedure Notes (Signed)
 Epidural Patient location during procedure: OB Start time: 08/30/2023 5:15 AM End time: 08/30/2023 5:25 AM  Staffing Anesthesiologist: Elmer Picker, MD Performed: anesthesiologist   Preanesthetic Checklist Completed: patient identified, IV checked, risks and benefits discussed, monitors and equipment checked, pre-op evaluation and timeout performed  Epidural Patient position: sitting Prep: DuraPrep and site prepped and draped Patient monitoring: continuous pulse ox, blood pressure, heart rate and cardiac monitor Approach: midline Location: L3-L4 Injection technique: LOR air  Needle:  Needle type: Tuohy  Needle gauge: 17 G Needle length: 9 cm Needle insertion depth: 4 cm Catheter type: closed end flexible Catheter size: 19 Gauge Catheter at skin depth: 9 cm Test dose: negative  Assessment Sensory level: T8 Events: blood not aspirated, no cerebrospinal fluid, injection not painful, no injection resistance, no paresthesia and negative IV test  Additional Notes Patient identified. Risks/Benefits/Options discussed with patient including but not limited to bleeding, infection, nerve damage, paralysis, failed block, incomplete pain control, headache, blood pressure changes, nausea, vomiting, reactions to medication both or allergic, itching and postpartum back pain. Confirmed with bedside nurse the patient's most recent platelet count. Confirmed with patient that they are not currently taking any anticoagulation, have any bleeding history or any family history of bleeding disorders. Patient expressed understanding and wished to proceed. All questions were answered. Sterile technique was used throughout the entire procedure. Please see nursing notes for vital signs. Test dose was given through epidural catheter and negative prior to continuing to dose epidural or start infusion. Warning signs of high block given to the patient including shortness of breath, tingling/numbness in hands,  complete motor block, or any concerning symptoms with instructions to call for help. Patient was given instructions on fall risk and not to get out of bed. All questions and concerns addressed with instructions to call with any issues or inadequate analgesia.  Reason for block:procedure for pain

## 2023-08-30 NOTE — MAU Note (Addendum)
.  Heather Hopkins is a 23 y.o. at [redacted]w[redacted]d here in MAU reporting: ctx since 0200 every 5 minutes. Denies VB or LOF. Reports FM is less than normal. Was 4cm yesterday. GBS neg.   LMP: NA  Onset of complaint: 0200 Pain score: 8 Vitals:   08/30/23 0449  BP: 128/71  Pulse: 89  Resp: 18  Temp: 97.6 F (36.4 C)     FHT: 120  Lab orders placed from triage: labor eval

## 2023-08-30 NOTE — Anesthesia Postprocedure Evaluation (Signed)
 Anesthesia Post Note  Patient: Heather Hopkins  Procedure(s) Performed: AN AD HOC LABOR EPIDURAL     Patient location during evaluation: Mother Baby Anesthesia Type: Epidural Level of consciousness: awake and alert Pain management: pain level controlled Vital Signs Assessment: post-procedure vital signs reviewed and stable Respiratory status: spontaneous breathing, nonlabored ventilation and respiratory function stable Cardiovascular status: stable Postop Assessment: no headache, no backache and epidural receding Anesthetic complications: no   No notable events documented.  Last Vitals:  Vitals:   08/30/23 1224 08/30/23 1316  BP: 124/70 110/73  Pulse: 69 63  Resp:  16  Temp: 37 C 36.8 C  SpO2: 100%     Last Pain:  Vitals:   08/30/23 1316  TempSrc: Oral  PainSc: 0-No pain   Pain Goal:                   Heather Hopkins

## 2023-08-31 LAB — CBC
HCT: 29.7 % — ABNORMAL LOW (ref 36.0–46.0)
Hemoglobin: 9.9 g/dL — ABNORMAL LOW (ref 12.0–15.0)
MCH: 30.2 pg (ref 26.0–34.0)
MCHC: 33.3 g/dL (ref 30.0–36.0)
MCV: 90.5 fL (ref 80.0–100.0)
Platelets: 270 10*3/uL (ref 150–400)
RBC: 3.28 MIL/uL — ABNORMAL LOW (ref 3.87–5.11)
RDW: 14 % (ref 11.5–15.5)
WBC: 14.3 10*3/uL — ABNORMAL HIGH (ref 4.0–10.5)
nRBC: 0 % (ref 0.0–0.2)

## 2023-08-31 MED ORDER — OXYCODONE HCL 5 MG PO TABS: 5.0000 mg | ORAL_TABLET | Freq: Four times a day (QID) | ORAL | 0 refills | Status: DC | PRN

## 2023-08-31 NOTE — Discharge Summary (Signed)
 Postpartum Discharge Summary  Date of Service updated 08/31/23     Patient Name: Heather Hopkins DOB: Jul 08, 2000 MRN: [id number]  Date of admission: 08/30/2023 Delivery date:08/30/2023 Delivering provider: Waynard Reeds Date of discharge: 08/31/2023  Admitting diagnosis: Normal labor [O80, Z37.9] Spontaneous vaginal delivery [O80] Intrauterine pregnancy: [redacted]w[redacted]d     Secondary diagnosis:  Principal Problem:   Normal labor Active Problems:   Spontaneous vaginal delivery  Additional problems: None    Discharge diagnosis: Term Pregnancy Delivered                                              Post partum procedures: n/a Augmentation: AROM Complications: None  Hospital course: Onset of Labor With Vaginal Delivery      23 y.o. yo [id number] at [redacted]w[redacted]d was admitted in Active Labor on 08/30/2023. Labor course was complicated by n/a  Membrane Rupture Time/Date: 8:56 AM,08/30/2023  Delivery Method:Vaginal, Spontaneous Operative Delivery:N/A Episiotomy: None Lacerations:  None Patient had a postpartum course complicated by  n/a.  She is ambulating, tolerating a regular diet, passing flatus, and urinating well. Patient is discharged home in stable condition on 08/31/23.  Newborn Data: Birth date:08/30/2023 Birth time:10:15 AM Gender:Female Living status:Living Apgars:9 ,10  Weight:2830 g  Magnesium Sulfate received: No BMZ received: No Rhophylac:N/A MMR:N/A T-DaP: no Flu: No RSV Vaccine received: No Transfusion:No Immunizations administered: Immunization History  Administered Date(s) Administered   PFIZER(Purple Top)SARS-COV-2 Vaccination 03/09/2020, 04/20/2020    Physical exam  Vitals:   08/31/23 0530 08/31/23 0837 08/31/23 1130 08/31/23 1406  BP: 107/62 113/69 134/79 115/72  Pulse: 67 60 66 72  Resp: 18 16 18 17   Temp: 97.7 F (36.5 C) 98.3 F (36.8 C) 97.7 F (36.5 C) 98.3 F (36.8 C)  TempSrc: Oral Oral Oral Oral  SpO2: 100% 100% 100% 100%  Weight:      Height:        General: alert, cooperative, and no distress Lochia: appropriate Uterine Fundus: firm Incision: N/A DVT Evaluation: No evidence of DVT seen on physical exam. Labs: Lab Results  Component Value Date   WBC 14.3 (H) 08/31/2023   HGB 9.9 (L) 08/31/2023   HCT 29.7 (L) 08/31/2023   MCV 90.5 08/31/2023   PLT 270 08/31/2023      Latest Ref Rng & Units 06/30/2021    2:20 PM  CMP  Glucose 70 - 99 mg/dL 76   BUN 6 - 20 mg/dL 9   Creatinine 1.47 - 8.29 mg/dL 5.62   Sodium 130 - 865 mmol/L 135   Potassium 3.5 - 5.1 mmol/L 3.8   Chloride 98 - 111 mmol/L 105   CO2 22 - 32 mmol/L 20   Calcium 8.9 - 10.3 mg/dL 9.1   Total Protein 6.5 - 8.1 g/dL 6.8   Total Bilirubin 0.3 - 1.2 mg/dL 0.6   Alkaline Phos 38 - 126 U/L 34   AST 15 - 41 U/L 15   ALT 0 - 44 U/L 15    Edinburgh Score:    02/01/2022    2:58 PM  Edinburgh Postnatal Depression Scale Screening Tool  I have been able to laugh and see the funny side of things. 0  I have looked forward with enjoyment to things. 1  I have blamed myself unnecessarily when things went wrong. 1  I have been anxious or worried for no good reason.  2  I have felt scared or panicky for no good reason. 0  Things have been getting on top of me. 1  I have been so unhappy that I have had difficulty sleeping. 0  I have felt sad or miserable. 1  I have been so unhappy that I have been crying. 1  The thought of harming myself has occurred to me. 0  Edinburgh Postnatal Depression Scale Total 7      After visit meds:  Allergies as of 08/31/2023   No Known Allergies      Medication List     STOP taking these medications    fluconazole 150 MG tablet Commonly known as: Diflucan       TAKE these medications    acetaminophen 325 MG tablet Commonly known as: Tylenol Take 2 tablets (650 mg total) by mouth every 6 (six) hours as needed (for pain scale < 4).   ibuprofen 600 MG tablet Commonly known as: ADVIL Take 1 tablet (600 mg total) by mouth  every 6 (six) hours as needed.   oxyCODONE 5 MG immediate release tablet Commonly known as: Oxy IR/ROXICODONE Take 1 tablet (5 mg total) by mouth every 6 (six) hours as needed for severe pain (pain score 7-10). What changed: when to take this         Discharge home in stable condition Infant Feeding: Bottle and Breast Infant Disposition:home with mother Discharge instruction: per After Visit Summary and Postpartum booklet. Activity: Advance as tolerated. Pelvic rest for 6 weeks.  Diet: routine diet Anticipated Birth Control: Unsure Postpartum Appointment:6 weeks Additional Postpartum F/U:  n/a Future Appointments:No future appointments. Follow up Visit:  Follow-up Information     [facility] Follow up in 6 week(s).   Contact information: [address]  [address] [place] [address] [phone]                     08/31/2023 [facility] GEFFEL Chestine Spore, MD

## 2023-08-31 NOTE — Progress Notes (Signed)
 Patient is doing well.  She is ambulating, voiding, tolerating PO.  Reports cramping pain with breastfeeding for which she has requested oxycodone, but states she is ready to go home ASAP.  Lochia is appropriate  Vitals:   08/30/23 1726 08/30/23 2121 08/31/23 0019 08/31/23 0530  BP: 111/69 118/64 99/66 107/62  Pulse: 69 62 69 67  Resp: 16 18 18 18   Temp: 98 F (36.7 C) 97.9 F (36.6 C) 98.2 F (36.8 C) 97.7 F (36.5 C)  TempSrc: Oral Oral Oral Oral  SpO2:  100% 100% 100%  Weight:      Height:        NAD Fundus firm Ext: no  Lab Results  Component Value Date   WBC 14.3 (H) 08/31/2023   HGB 9.9 (L) 08/31/2023   HCT 29.7 (L) 08/31/2023   MCV 90.5 08/31/2023   PLT 270 08/31/2023    --/--/O POS (04/09 0446)  A/P 22 y.o. [id number] PPD#1 s/p TSVD. Routine postpartum care.  Will add heating pad to pain management.  Reports pain at rest is a 3-4, only worsens with breastfeeding.  Reviewed pain management goals.  If baby ok for discharge to day, she desires to go home Desires circumcision.   Discussed r/b/a of the procedure.  Reviewed that circumcision is an elective surgical procedure and not considered medically necessary.  Reviewed the risks of the procedure including the risk of infection, bleeding, damage to surrounding structures, including scrotum, shaft, urethra and head of penis, and an undesired cosmetic effect requiring additional procedures for revision.  Consent signed.    [facility] GEFFEL [employer]

## 2023-08-31 NOTE — Lactation Note (Signed)
 This note was copied from a baby's chart. Lactation Consultation Note  Patient Name: Heather Hopkins [id number] Date: 08/31/2023 Age:22 hours Reason for consult: Follow-up assessment  P1, Baby sleeping after circumcision. Feed on demand with cues.  Goal 8-12+ times per day after first 24 hrs.  Place baby STS if not cueing.  Reviewed engorgement care and monitoring voids/stools. Encouraged offering both breasts for feeding. Answered questions regarding feeding and frequency.   Maternal Data Has patient been taught Hand Expression?: Yes  Feeding Mother's Current Feeding Choice: Breast Milk Nipple Type: Nfant Slow Flow (purple)  LATCH Score Latch: Grasps breast easily, tongue down, lips flanged, rhythmical sucking.  Audible Swallowing: Spontaneous and intermittent  Type of Nipple: Everted at rest and after stimulation  Comfort (Breast/Nipple): Soft / non-tender  Hold (Positioning): No assistance needed to correctly position infant at breast.  LATCH Score: 10   Lactation Tools Discussed/Used    Interventions Interventions: Education  Discharge Discharge Education: Engorgement and breast care;Warning signs for feeding baby  Consult Status Consult Status: Complete Date: 08/31/23    Dahlia Byes [facility] 08/31/2023, 2:36 PM

## 2023-08-31 NOTE — Progress Notes (Signed)
 Mom called out at 0345a, said she has pain of 7 (abdomen, cramping).  She states everytime she wakes up her cramping is back to a 7.  Told mom she can't have more oxycodone until 25-30 minutes.  She said she understood, told  her I would return then with the oxycodone dose.

## 2023-09-02 IMAGING — US US OB COMP LESS 14 WK
1 series · 15 of 28 positions shown · non-contrast
Comparison: 03/23/2021.

CLINICAL DATA: Abdominal pain, nausea and vomiting. Nine weeks and
3 days pregnant by last menstrual period.

EXAM:
OBSTETRIC <14 WK ULTRASOUND
TECHNIQUE: Transabdominal ultrasound was performed for evaluation of the
gestation as well as the maternal uterus and adnexal regions.

[Series 1: us ob comp less 14 wk · 34 acquisitions, 15 frames shown]
[im 1/34]
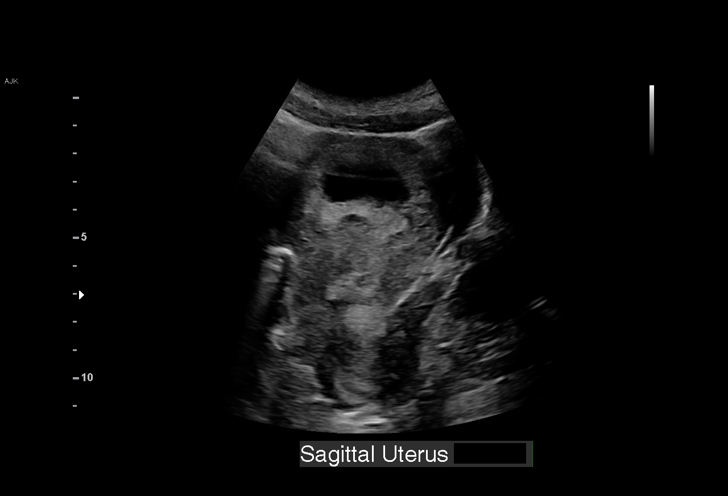
[im 3/34]
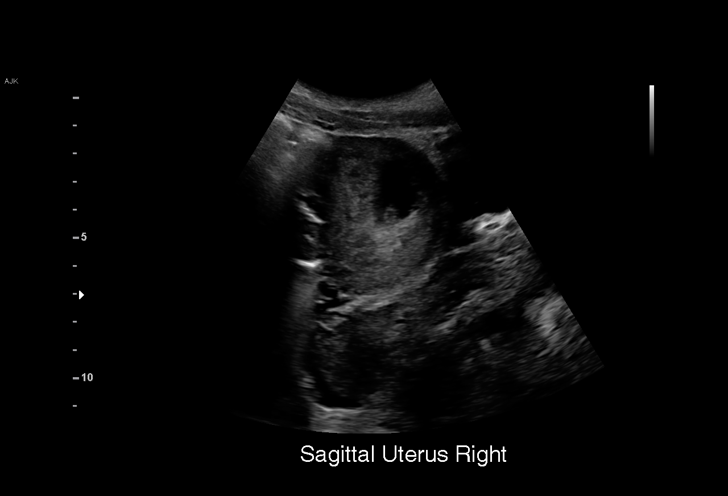
[im 5/34]
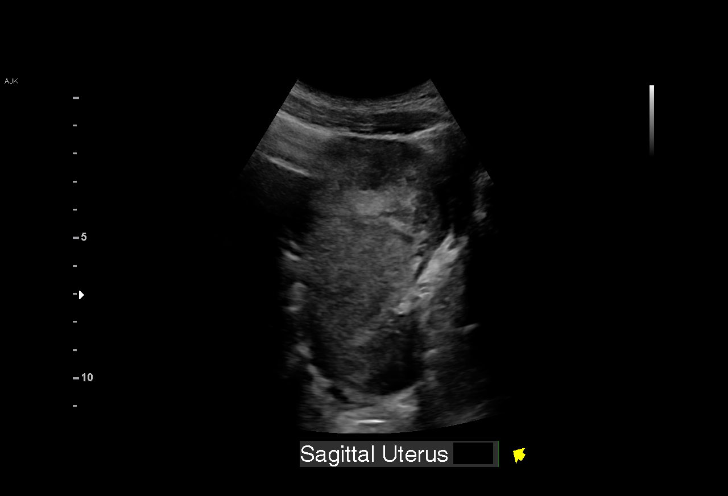
[im 8/34]
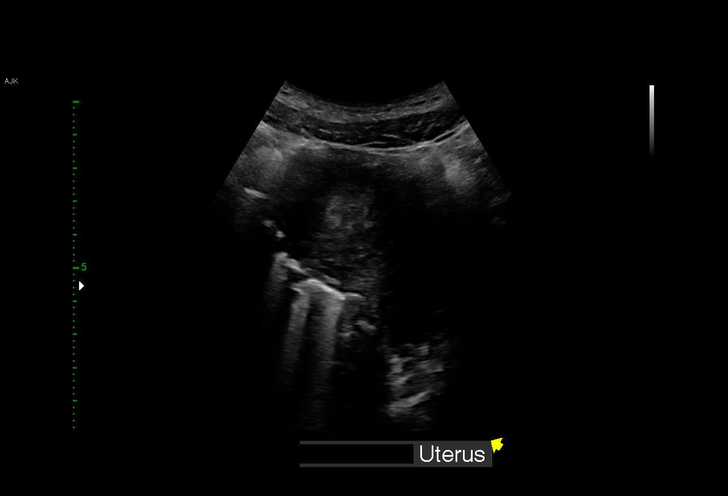
[im 10/34]
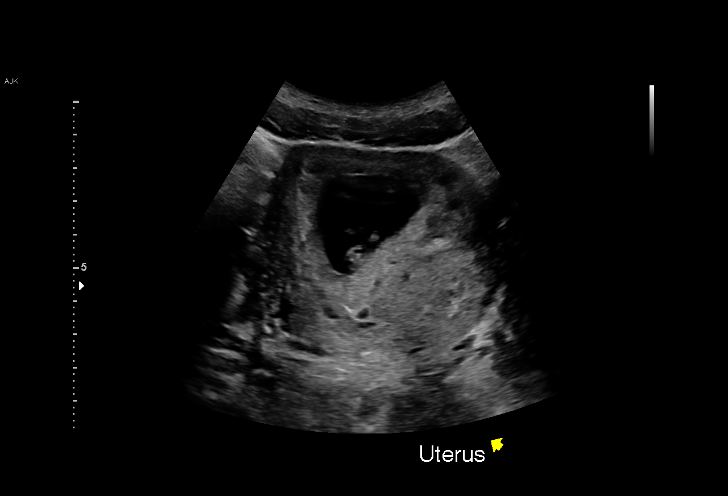
[im 13/34]
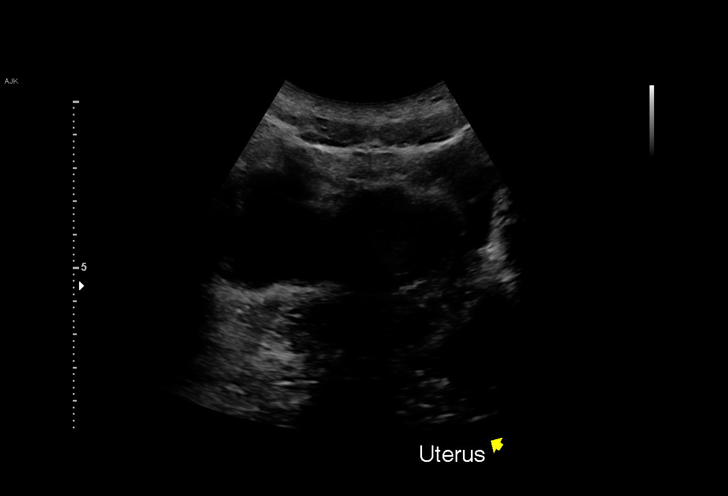
[im 15/34]
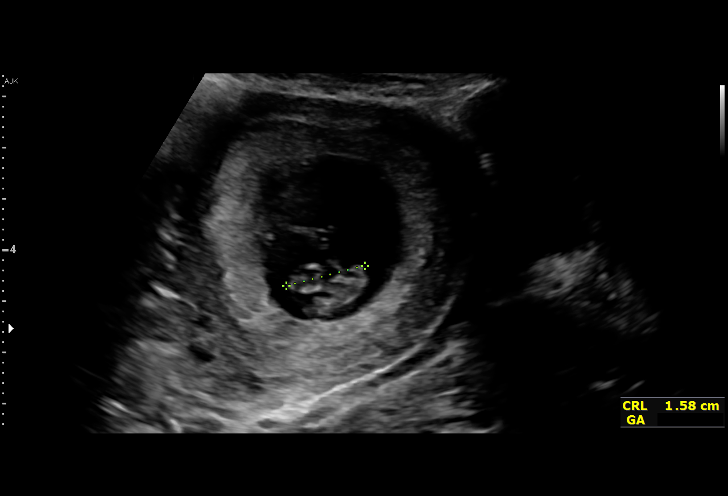
[im 18/34]
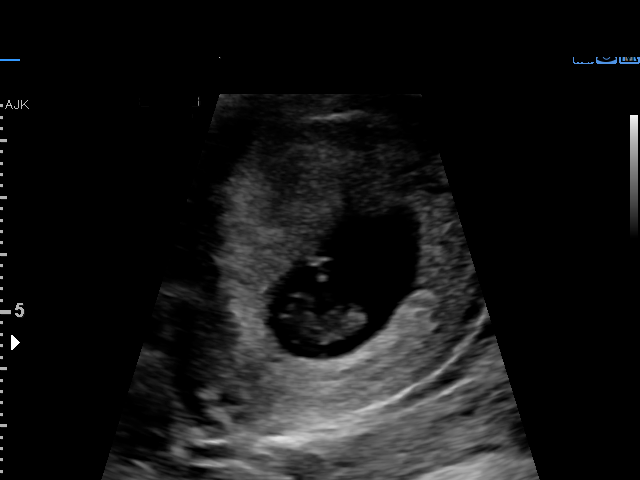
[im 19/34]
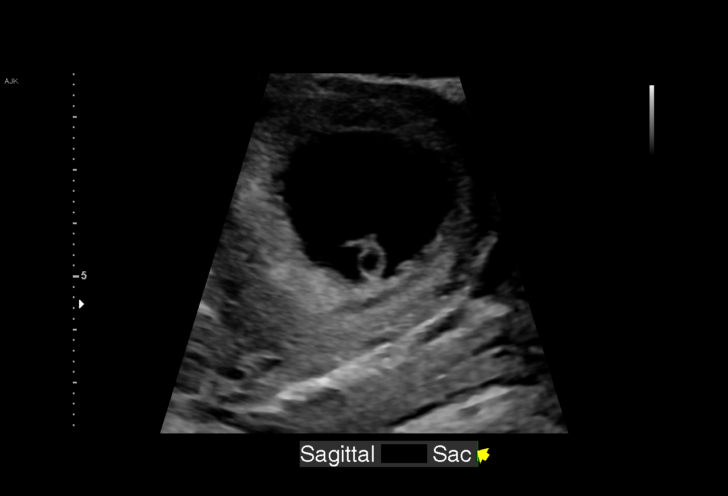
[im 21/34]
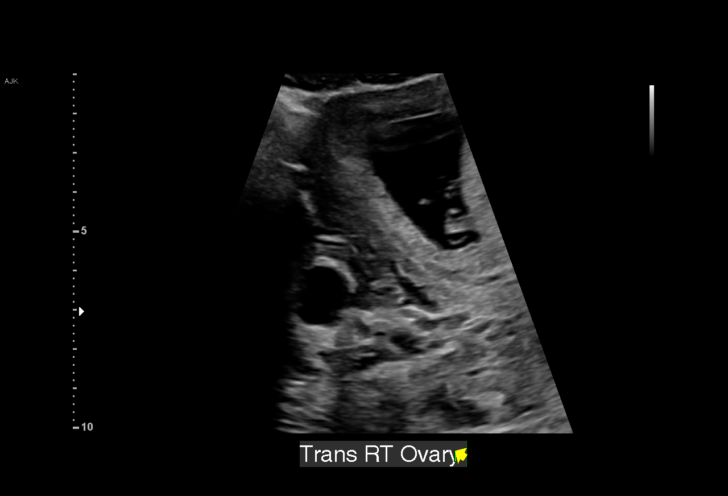
[im 24/34]
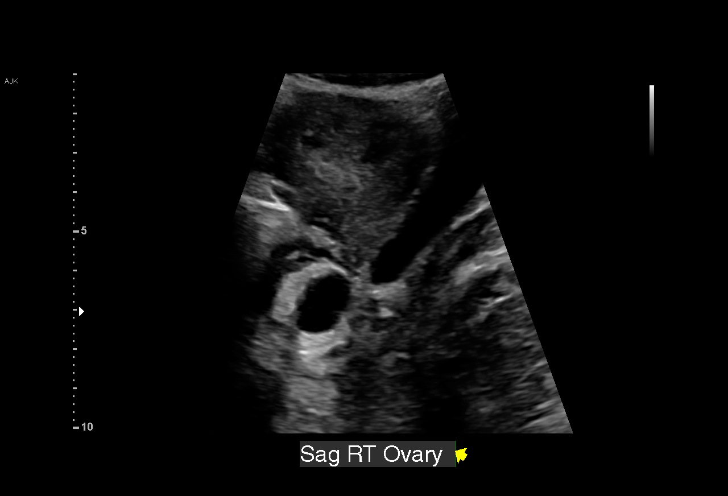
[im 26/34]
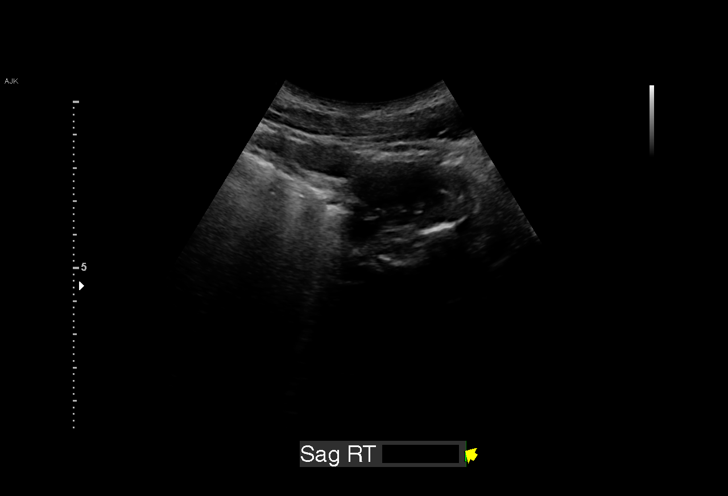
[im 29/34]
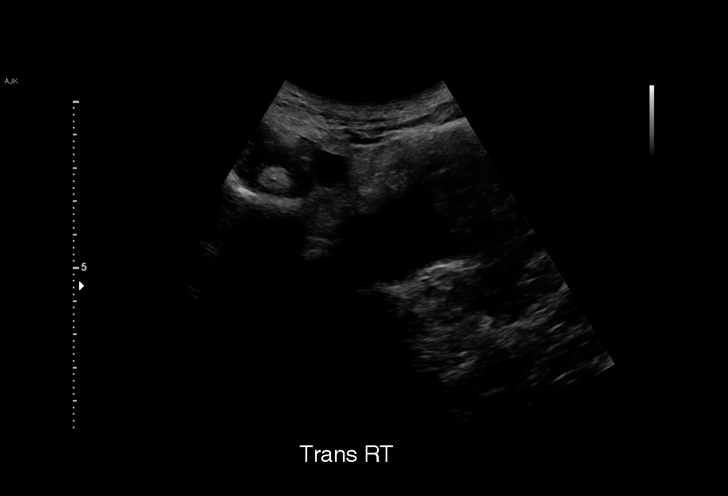
[im 31/34]
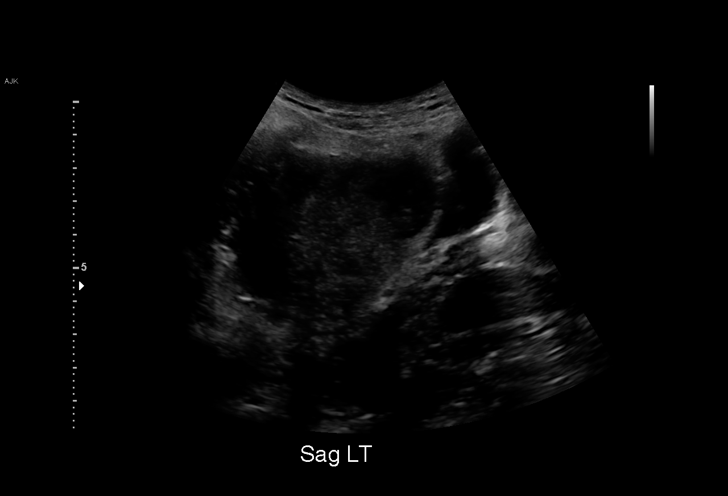
[im 34/34]
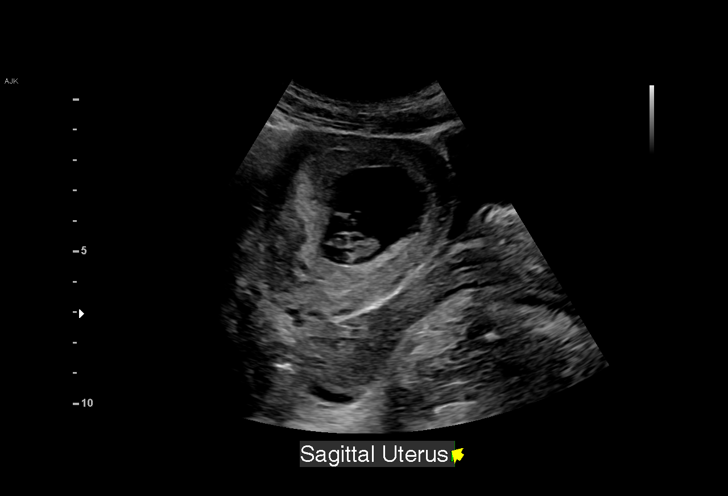

[15 of 28 positions shown; findings below may reference images not displayed]

FINDINGS: Intrauterine gestational sac: Visualized

Yolk sac:  Visualized

Embryo:  Visualized

Cardiac Activity: Visualized

Heart Rate: 157 bpm

CRL:   16.0 mm   7 w 6 d                  US EDC: 02/10/2022

Subchorionic hemorrhage:  None visualized.

Maternal uterus/adnexae: Poorly visualized right ovary, grossly
normal. The left ovary was not identified. No free peritoneal fluid.
IMPRESSION: Single live intrauterine gestation with an estimated gestational age
of 7 weeks and 6 days. No complicating features.

## 2023-09-09 ENCOUNTER — Telehealth (HOSPITAL_COMMUNITY): Payer: Self-pay | Admitting: *Deleted

## 2023-09-09 NOTE — Telephone Encounter (Signed)
 09/09/2023  Name: Heather Hopkins MRN: [id number] DOB: 2001-04-04  Reason for Call:  Transition of Care Hospital Discharge Call  Contact Status: Patient Contact Status: Complete  Language assistant needed:          Follow-Up Questions: Do You Have Any Concerns About Your Health As You Heal From Delivery?: No Do You Have Any Concerns About Your Infants Health?: No  Edinburgh Postnatal Depression Scale:  In the Past 7 Days: I have been able to laugh and see the funny side of things.: As much as I always could I have looked forward with enjoyment to things.: As much as I ever did I have blamed myself unnecessarily when things went wrong.: Not very often I have been anxious or worried for no good reason.: Yes, sometimes I have felt scared or panicky for no good reason.: No, not at all Things have been getting on top of me.: Yes, sometimes I haven't been coping as well as usual I have been so unhappy that I have had difficulty sleeping.: Not at all I have felt sad or miserable.: No, not at all I have been so unhappy that I have been crying.: No, never The thought of harming myself has occurred to me.: Never Edinburgh Postnatal Depression Scale Total: 5  PHQ2-9 Depression Scale:     Discharge Follow-up:    Post-discharge interventions: Reviewed Newborn Safe Sleep Practices  Signature Julien Odor, RN, 09/09/23, [phone]

## 2023-09-20 ENCOUNTER — Telehealth: Payer: Self-pay | Admitting: Nurse Practitioner

## 2023-09-20 DIAGNOSIS — L709 Acne, unspecified: Secondary | ICD-10-CM | POA: Diagnosis not present

## 2023-09-20 DIAGNOSIS — N760 Acute vaginitis: Secondary | ICD-10-CM

## 2023-09-20 DIAGNOSIS — B9689 Other specified bacterial agents as the cause of diseases classified elsewhere: Secondary | ICD-10-CM | POA: Diagnosis not present

## 2023-09-20 MED ORDER — TRETINOIN 0.025 % EX CREA: TOPICAL_CREAM | Freq: Every day | CUTANEOUS | 1 refills | Status: AC

## 2023-09-20 MED ORDER — METRONIDAZOLE 500 MG PO TABS
500.0000 mg | ORAL_TABLET | Freq: Two times a day (BID) | ORAL | 0 refills | Status: AC
Start: 2023-09-20 — End: 2023-09-27

## 2023-09-20 NOTE — Progress Notes (Signed)
 Virtual Visit Consent   Heather Hopkins, you are scheduled for a virtual visit with a [facility] provider today. Just as with appointments in the office, your consent must be obtained to participate. Your consent will be active for this visit and any virtual visit you may have with one of our providers in the next 365 days. If you have a MyChart account, a copy of this consent can be sent to you electronically.  As this is a virtual visit, video technology does not allow for your provider to perform a traditional examination. This may limit your provider's ability to fully assess your condition. If your provider identifies any concerns that need to be evaluated in person or the need to arrange testing (such as labs, EKG, etc.), we will make arrangements to do so. Although advances in technology are sophisticated, we cannot ensure that it will always work on either your end or our end. If the connection with a video visit is poor, the visit may have to be switched to a telephone visit. With either a video or telephone visit, we are not always able to ensure that we have a secure connection.  By engaging in this virtual visit, you consent to the provision of healthcare and authorize for your insurance to be billed (if applicable) for the services provided during this visit. Depending on your insurance coverage, you may receive a charge related to this service.  I need to obtain your verbal consent now. Are you willing to proceed with your visit today? Heather Hopkins has provided verbal consent on 09/20/2023 for a virtual visit (video or telephone). Mardene Shake, FNP  Date: 09/20/2023 5:02 PM   Virtual Visit via Video Note   I, Mardene Shake, connected with  Heather Hopkins  ([id number], June 13, 2000) on 09/20/23 at  5:00 PM EDT by a video-enabled telemedicine application and verified that I am speaking with the correct person using two identifiers.  Location: Patient: Virtual Visit Location  Patient: Home Provider: Virtual Visit Location Provider: Home Office   I discussed the limitations of evaluation and management by telemedicine and the availability of in person appointments. The patient expressed understanding and agreed to proceed.    History of Present Illness: Heather Hopkins is a 23 y.o. who identifies as a female who was assigned female at birth, and is being seen today for vaginal discharge   She is 3 weeks post partum  She had a vaginal birth  Denies any sutures in place or traumatic delivery   Has noted a smell since being sexually active (2 weeks post partum/1 week ago)  She feels it is similar to BV that she has had in the past   She is having thinner discharge with odor denies urinary symptoms   She is not breastfeeding   She is also starting to notice that her acne is returning She use RetinA prior to pregnancy and after her last pregnancy. Notes that her acne seemed to increase in her post partum period with her previous child as well   Problems: There are no active problems to display for this patient.   Allergies: NKDA  Medications: No current outpatient medications on file.  Observations/Objective: Patient is well-developed, well-nourished in no acute distress.  Resting comfortably  at home.  Head is normocephalic, atraumatic.  No labored breathing.  Speech is clear and coherent with logical content.  Patient is alert and oriented at baseline.    Assessment and Plan:   1. Bacterial  vaginitis  Meds ordered this encounter  Medications   metroNIDAZOLE  (FLAGYL ) 500 MG tablet    Sig: Take 1 tablet (500 mg total) by mouth 2 (two) times daily for 7 days.    Dispense:  14 tablet    Refill:  0    2. Acne, unspecified acne type  - tretinoin  (RETIN-A ) 0.025 % cream; Apply topically at bedtime.  Dispense: 45 g; Refill: 1    Advised follow up with PCP and OBGYN as scheduled, earlier if symptoms persist or with new concerns  Follow Up  Instructions: I discussed the assessment and treatment plan with the patient. The patient was provided an opportunity to ask questions and all were answered. The patient agreed with the plan and demonstrated an understanding of the instructions.  A copy of instructions were sent to the patient via MyChart unless otherwise noted below.    The patient was advised to call back or seek an in-person evaluation if the symptoms worsen or if the condition fails to improve as anticipated.    Mardene Shake, FNP

## 2023-11-16 ENCOUNTER — Telehealth

## 2023-11-16 DIAGNOSIS — N760 Acute vaginitis: Secondary | ICD-10-CM | POA: Diagnosis not present

## 2023-11-16 DIAGNOSIS — B9689 Other specified bacterial agents as the cause of diseases classified elsewhere: Secondary | ICD-10-CM

## 2023-11-16 MED ORDER — CLINDAMYCIN HCL 300 MG PO CAPS: 300.0000 mg | ORAL_CAPSULE | Freq: Two times a day (BID) | ORAL | 0 refills | Status: AC

## 2023-11-16 NOTE — Progress Notes (Signed)
 Virtual Visit Consent   Heather Hopkins, you are scheduled for a virtual visit with a  provider today. Just as with appointments in the office, your consent must be obtained to participate. Your consent will be active for this visit and any virtual visit you may have with one of our providers in the next 365 days. If you have a MyChart account, a copy of this consent can be sent to you electronically.  As this is a virtual visit, video technology does not allow for your provider to perform a traditional examination. This may limit your provider's ability to fully assess your condition. If your provider identifies any concerns that need to be evaluated in person or the need to arrange testing (such as labs, EKG, etc.), we will make arrangements to do so. Although advances in technology are sophisticated, we cannot ensure that it will always work on either your end or our end. If the connection with a video visit is poor, the visit may have to be switched to a telephone visit. With either a video or telephone visit, we are not always able to ensure that we have a secure connection.  By engaging in this virtual visit, you consent to the provision of healthcare and authorize for your insurance to be billed (if applicable) for the services provided during this visit. Depending on your insurance coverage, you may receive a charge related to this service.  I need to obtain your verbal consent now. Are you willing to proceed with your visit today? Heather Hopkins has provided verbal consent on 11/16/2023 for a virtual visit (video or telephone). Heather Hopkins, [place]  Date: 11/16/2023 6:47 PM   Virtual Visit via Video Note   I, Heather Hopkins, connected with  Heather Hopkins  ([id number], May 01, 2001) on 11/16/23 at  6:45 PM EDT by a video-enabled telemedicine application and verified that I am speaking with the correct person using two identifiers.  Location: Patient: Virtual  Visit Location Patient: Home Provider: Virtual Visit Location Provider: Home Office   I discussed the limitations of evaluation and management by telemedicine and the availability of in person appointments. The patient expressed understanding and agreed to proceed.    History of Present Illness: Heather Hopkins is a 23 y.o. who identifies as a female who was assigned female at birth, and is being seen today for possible bv. Endorses history of episode of BV before and this feels similar. Notes over past week or so of foul-smelling discharge with irritation. Denies change to soaps and lotions.  HPI: HPI  Problems:  Patient Active Problem List   Diagnosis Date Noted   Spontaneous vaginal delivery 08/30/2023   Normal labor 01/20/2022    Allergies: No Known Allergies Medications:  Current Outpatient Medications:    clindamycin (CLEOCIN) 300 MG capsule, Take 1 capsule (300 mg total) by mouth 2 (two) times daily., Disp: 14 capsule, Rfl: 0   acetaminophen  (TYLENOL ) 325 MG tablet, Take 2 tablets (650 mg total) by mouth every 6 (six) hours as needed (for pain scale < 4)., Disp: , Rfl:    ibuprofen  (ADVIL ) 600 MG tablet, Take 1 tablet (600 mg total) by mouth every 6 (six) hours as needed., Disp: 60 tablet, Rfl: 1   tretinoin (RETIN-A) 0.025 % cream, Apply topically at bedtime., Disp: 45 g, Rfl: 1  Observations/Objective: Patient is well-developed, well-nourished in no acute distress.  Resting comfortably at home.  Head is normocephalic, atraumatic.  No labored breathing. Speech is  clear and coherent with logical content.  Patient is alert and oriented at baseline.   Assessment and Plan: 1. Bacterial vaginosis (Primary) - clindamycin (CLEOCIN) 300 MG capsule; Take 1 capsule (300 mg total) by mouth 2 (two) times daily.  Dispense: 14 capsule; Refill: 0  Recently pregnant and delivered. No concern for pregnancy. Is not breastfeeding. Wants to avoid oral Flagyl  and vaginal gels. Will Rx  Clindamycin BID x 7 days. GYN follow-up discussed giving history of frequent BV.   Follow Up Instructions: I discussed the assessment and treatment plan with the patient. The patient was provided an opportunity to ask questions and all were answered. The patient agreed with the plan and demonstrated an understanding of the instructions.  A copy of instructions were sent to the patient via MyChart unless otherwise noted below.   The patient was advised to call back or seek an in-person evaluation if the symptoms worsen or if the condition fails to improve as anticipated.    Heather Velma Lunger, PA-C

## 2023-11-16 NOTE — Patient Instructions (Signed)
 Heather Hopkins, thank you for joining [name], PA-C for today's virtual visit.  While this provider is not your primary care provider (PCP), if your PCP is located in our provider database this encounter information will be shared with them immediately following your visit.   A [facility] MyChart account gives you access to today's visit and all your visits, tests, and labs performed at [facility]  click here if you don't have a [facility] MyChart account or go to [email or web address]  Consent: (Patient) Heather Hopkins provided verbal consent for this virtual visit at the beginning of the encounter.  Current Medications:  Current Outpatient Medications:    acetaminophen  (TYLENOL ) 325 MG tablet, Take 2 tablets (650 mg total) by mouth every 6 (six) hours as needed (for pain scale < 4)., Disp: , Rfl:    ibuprofen  (ADVIL ) 600 MG tablet, Take 1 tablet (600 mg total) by mouth every 6 (six) hours as needed., Disp: 60 tablet, Rfl: 1   oxyCODONE  (OXY IR/ROXICODONE ) 5 MG immediate release tablet, Take 1 tablet (5 mg total) by mouth every 6 (six) hours as needed for severe pain (pain score 7-10)., Disp: 5 tablet, Rfl: 0   tretinoin (RETIN-A) 0.025 % cream, Apply topically at bedtime., Disp: 45 g, Rfl: 1   Medications ordered in this encounter:  No orders of the defined types were placed in this encounter.    *If you need refills on other medications prior to your next appointment, please contact your pharmacy*  Follow-Up: Call back or seek an in-person evaluation if the symptoms worsen or if the condition fails to improve as anticipated.  [facility] [phone]  Other Instructions Vaginal Infection (Bacterial Vaginosis): What to Know  Bacterial vaginosis is an infection of the vagina. It happens when the balance of normal germs (bacteria) in the vagina changes. If you don't get treated, it can make it easier for you to get other  infections from sex. These are called sexually transmitted infections (STIs). If you're pregnant, you need to get treated right away. This infection can cause a baby to be born early or at a low birth weight. What are the causes? This infection happens when too many harmful germs grow in the vagina. You can't get this infection from toilet seats, bedsheets, swimming pools, or things that touch your vagina. What increases the risk? Having sex with a new person or more than one person. Having sex without protection. Douching. Having an intrauterine device (IUD). Smoking. Using drugs or drinking alcohol. These can lead you to do risky things. Taking certain antibiotics. Being pregnant. What are the signs or symptoms? Some females have no symptoms. Symptoms may include: A gray or white discharge from your vagina. It can be watery or foamy. A fishy smell. This can happen after sex or during your menstrual period. Itching in and around your vagina. Burning or pain when you pee. How is this treated? This infection is treated with antibiotics. These may be given to you as: A pill. A cream for your vagina. A medicine that you put into your vagina (suppository). If the infection comes back, you may need more antibiotics. Follow these instructions at home: Medicines Take your medicines as told. Take or use your antibiotics as told. Do not stop using them even if you start to feel better. General instructions If the person you have sex with is a female, tell her that you have this infection. She will need to follow up  with her doctor. Female partners don't need to be treated. Do not have sex until you finish treatment. Drink more fluids as told. Keep your vagina and butt clean. Wash these areas with warm water each day. Wipe from front to back after you poop. If you're breastfeeding a baby, talk to your doctor if you should keep doing so during treatment. How is this prevented? Self-care Do  not douche. Do not use deodorant sprays on your vagina. Wear cotton underwear. Do not wear tight pants and pantyhose, especially in the summer. Safe sex Use condoms the correct way and every time you have sex. Use dental dams to protect yourself during oral sex. Limit how many people you have sex with. Get tested for STIs. The person you have sex with should also get tested. Drugs and alcohol Do not smoke, vape, or use nicotine or tobacco. Do not use drugs. Limit the amount of alcohol you drink because it can lead you to do risky things. Where to find more information To learn more: Go to TonerPromos.no. Click Health Topics A-Z. Type bacterial vaginosis in the search bar. American Sexual Health Association (ASHA): ashasexualhealth.org U.S. Department of Health and [employer], Office on Women's Health: TravelLesson.ca Contact a doctor if: Your symptoms don't get better, even after treatment. You have more discharge or pain when you pee. You have a fever or chills. You have pain in your belly or in the area between your hips. You have pain during sex. You bleed from your vagina between menstrual periods. This information is not intended to replace advice given to you by your health care provider. Make sure you discuss any questions you have with your health care provider. Document Revised: 10/26/2022 Document Reviewed: 10/26/2022 Elsevier Patient Education  2024 Elsevier Inc.   If you have been instructed to have an in-person evaluation today at a local Urgent Care facility, please use the link below. It will take you to a list of all of our available [facility], including address, phone number and hours of operation. Please do not delay care.  [facility]  If you or a family member do not have a primary care provider, use the link below to schedule a visit and establish care. When you choose a [facility] primary care physician or advanced practice  provider, you gain a long-term partner in health. Find a Primary Care Provider  Learn more about [facility] in-office and virtual care options:  - Get Care Now
